# Patient Record
Sex: Male | Born: 1975
Health system: Southern US, Community
[De-identification: ages and names within clinical notes are randomized; demographics above are authoritative.]

## PROBLEM LIST (undated history)

## (undated) DIAGNOSIS — Z8619 Personal history of other infectious and parasitic diseases: Secondary | ICD-10-CM

## (undated) DIAGNOSIS — C4490 Unspecified malignant neoplasm of skin, unspecified: Secondary | ICD-10-CM

## (undated) HISTORY — DX: Personal history of other infectious and parasitic diseases: Z86.19

## (undated) HISTORY — DX: Unspecified malignant neoplasm of skin, unspecified: C44.90

## (undated) HISTORY — PX: NO PAST SURGERIES: SHX2092

---

## 2006-06-28 ENCOUNTER — Emergency Department (HOSPITAL_COMMUNITY): Admission: EM | Admit: 2006-06-28 | Discharge: 2006-06-28 | Payer: Self-pay | Admitting: Emergency Medicine

## 2008-01-09 IMAGING — CT CT HEAD W/O CM
4 series · 17 of 30 positions shown, 19 images · non-contrast
Comparison: None

CLINICAL DATA: Fall. Alcohol intoxication. Laceration along the left
supraorbital rim.

HEAD CT WITHOUT CONTRAST
TECHNIQUE: 5mm collimated images were obtained from the base of the skull
through the vertex according to standard protocol without contrast.
TECHNIQUE: Axial and coronal plane CT imaging of the maxillofacial structures
was performed including the facial bones, paranasal sinuses, and orbits.  No
intravenous contrast was administered.

[Series 2: brain · axial · 0.47mm/px · z∈[+188,+235]mm · 2 of 28 slices shown]
[im 10/28  brain]
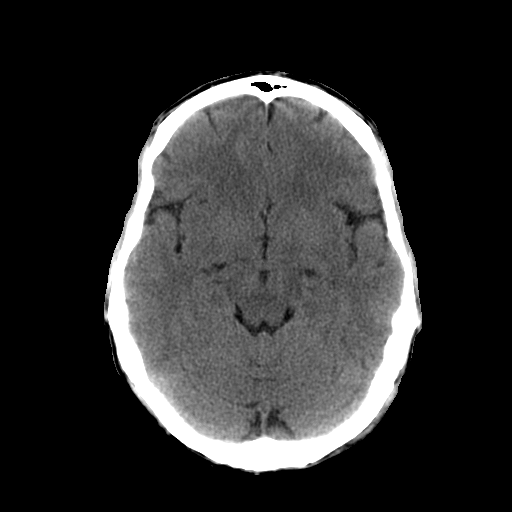
[im 19/28  brain]
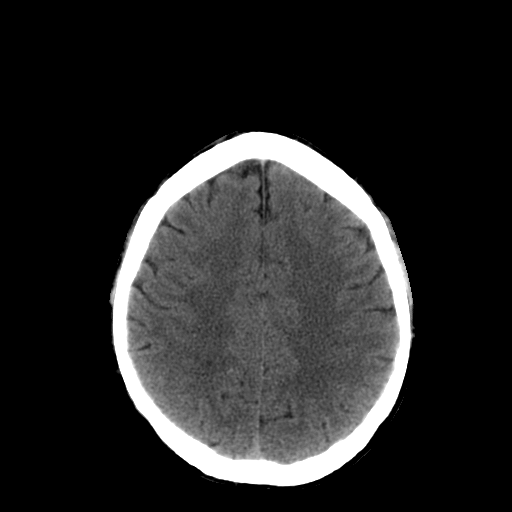

[Series 3: recon 2: brain · axial · 0.47mm/px · z∈[+158,+263]mm · 6 of 56 slices shown]
[im 8/56  brain]
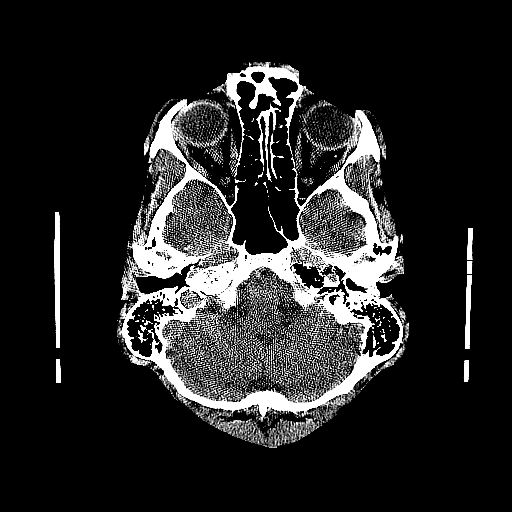
[im 16/56  brain]
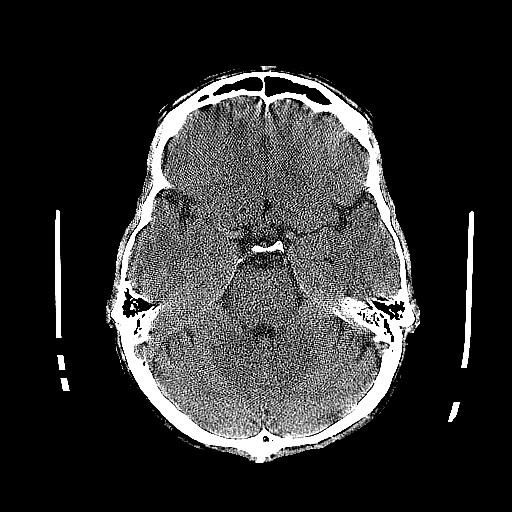
[im 24/56  brain]
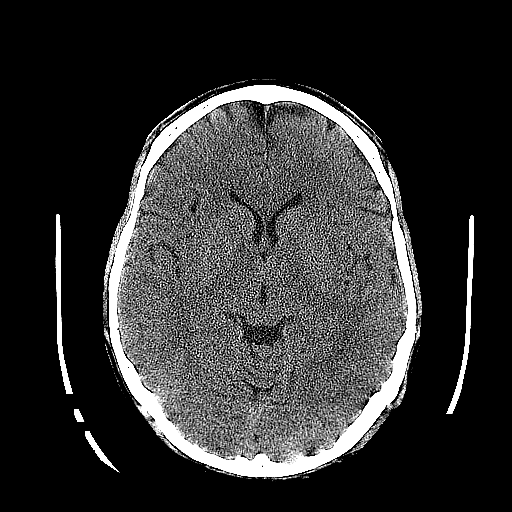
[im 32/56  brain]
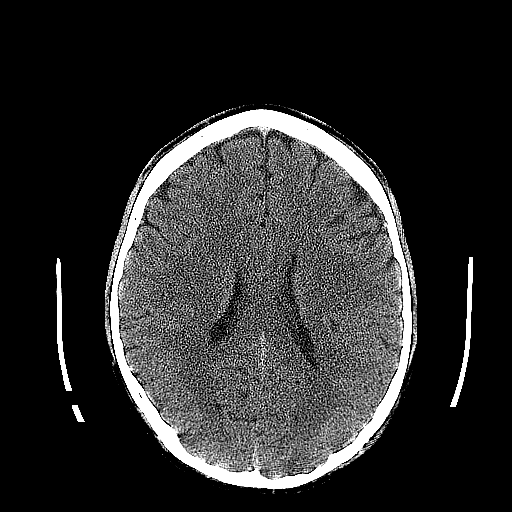
[im 40/56  brain]
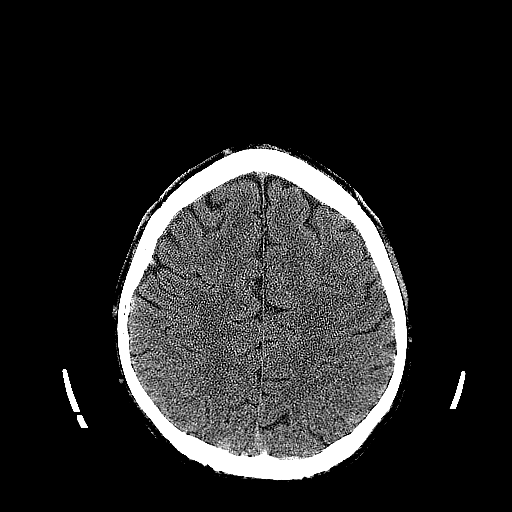
[im 48/56  brain]
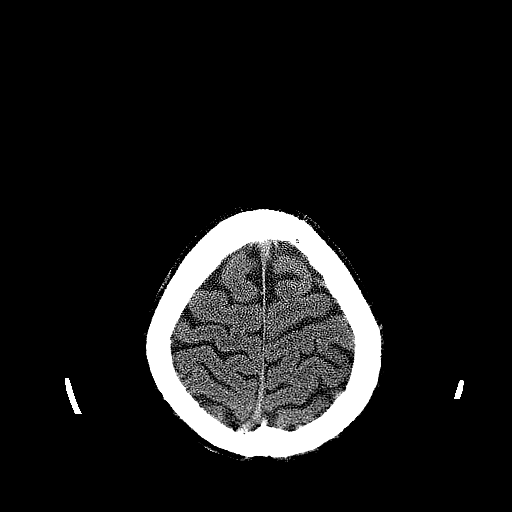

[Series 5: recon 2: supine facial bones · axial · 0.33mm/px · z∈[+42,+157]mm · 7 of 62 slices shown, 9 images]
[im 8/62  brain]
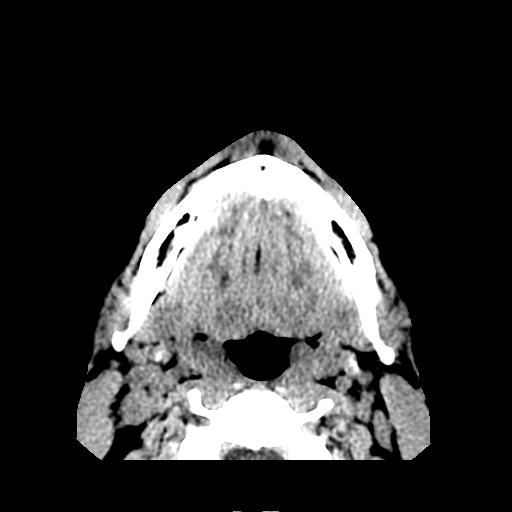
[im 8/62  bone]
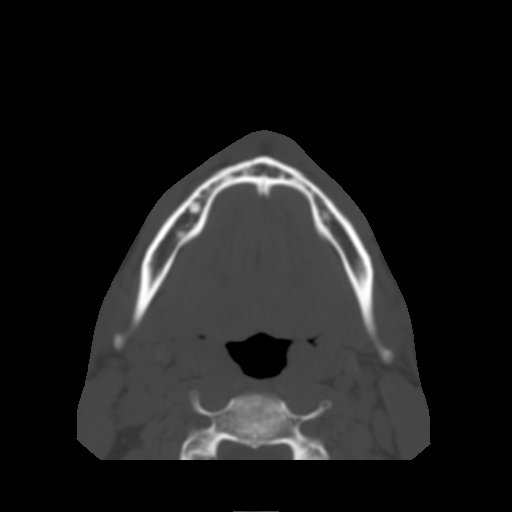
[im 16/62  brain]
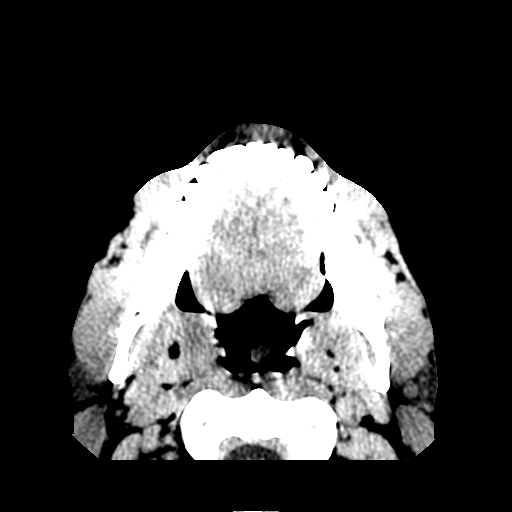
[im 23/62  brain]
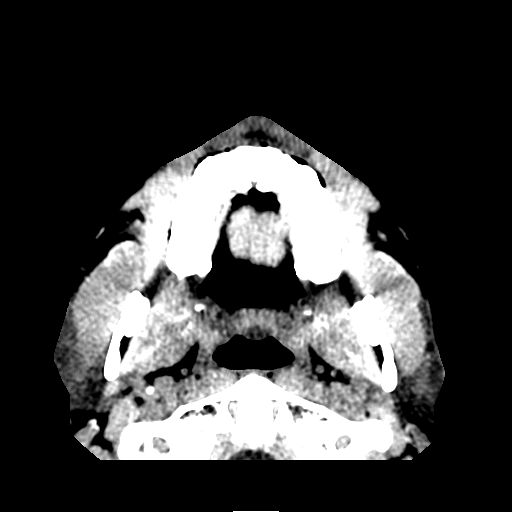
[im 31/62  brain]
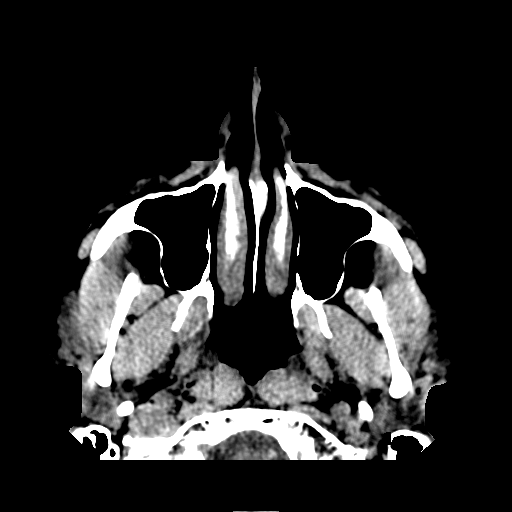
[im 39/62  brain]
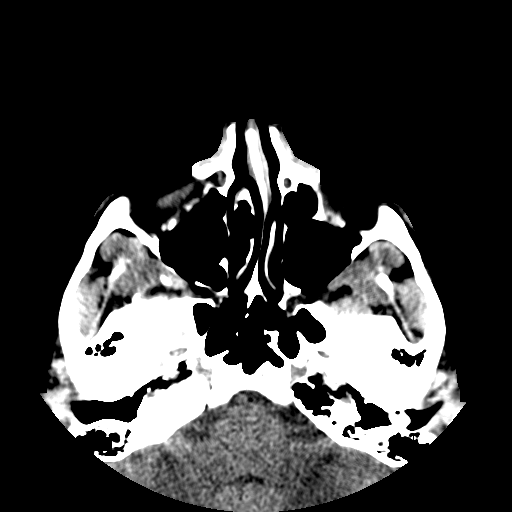
[im 39/62  bone]
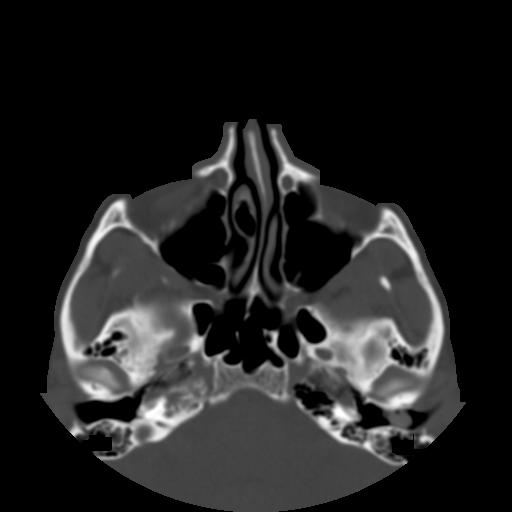
[im 46/62  brain]
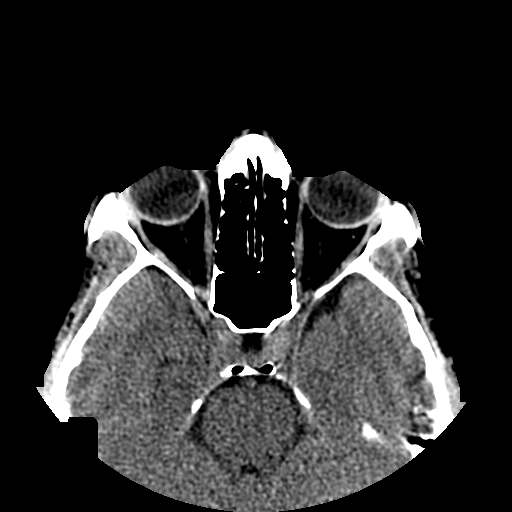
[im 54/62  brain]
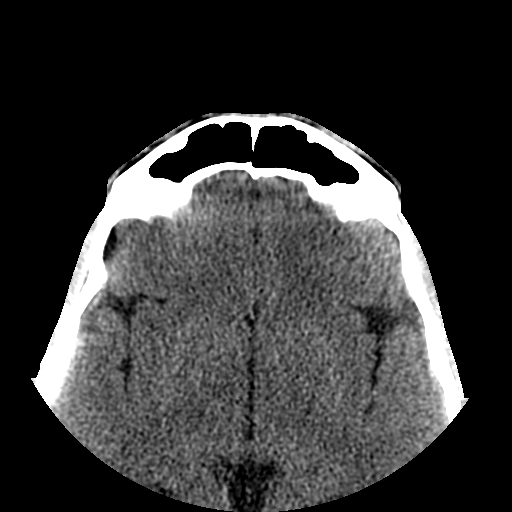

[Series 600: reformatted · coronal · 0.29mm/px · 2 of 63 slices shown]
[im 8/63  brain]
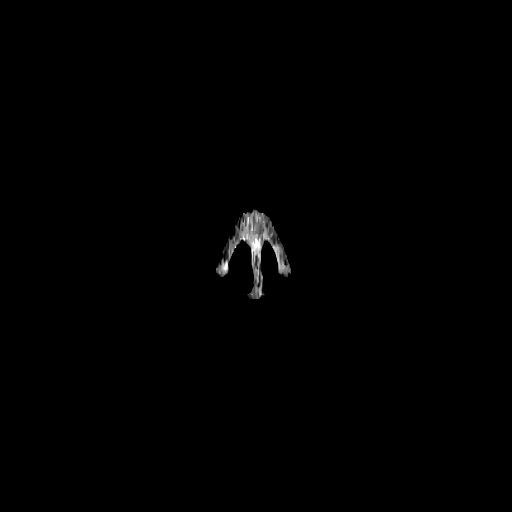
[im 16/63  brain]
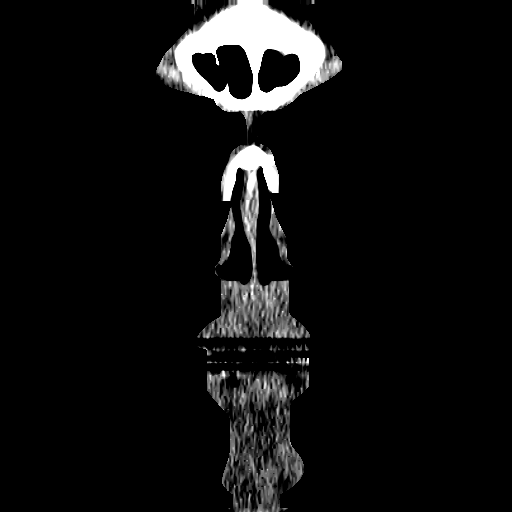

[17 of 30 positions shown; findings below may reference images not displayed]

FINDINGS: No intracranial hemorrhage, mass lesion, or acute CVA is identified.
No hydrocephalus or significant abnormal extra-axial fluid collection noted. No
acute intracranial findings are identified.

IMPRESSION

1. No acute intracranial findings.

MAXILLOFACIAL CT WITHOUT CONTRAST
FINDINGS: No facial fracture identified. No significant intraorbital
abnormality detected. The globes appear grossly intact. The zygomatic arches and
pterygoid plates appear normal.

IMPRESSION

1. No facial fracture or orbital abnormality is identified.

## 2017-09-10 ENCOUNTER — Ambulatory Visit: Payer: BLUE CROSS/BLUE SHIELD | Admitting: Physician Assistant

## 2017-09-10 ENCOUNTER — Encounter: Payer: Self-pay | Admitting: Physician Assistant

## 2017-09-10 ENCOUNTER — Other Ambulatory Visit: Payer: Self-pay

## 2017-09-10 VITALS — BP 120/90 | HR 88 | Temp 98.0°F | Resp 14 | Ht 74.0 in | Wt 172.0 lb

## 2017-09-10 DIAGNOSIS — F419 Anxiety disorder, unspecified: Secondary | ICD-10-CM | POA: Diagnosis not present

## 2017-09-10 DIAGNOSIS — F329 Major depressive disorder, single episode, unspecified: Secondary | ICD-10-CM | POA: Diagnosis not present

## 2017-09-10 DIAGNOSIS — F32A Depression, unspecified: Secondary | ICD-10-CM

## 2017-09-10 MED ORDER — CITALOPRAM HYDROBROMIDE 20 MG PO TABS: ORAL_TABLET | ORAL | 3 refills | Status: DC

## 2017-09-10 NOTE — Patient Instructions (Signed)
Please start the Citalopram taking as directed. Use the handout given to schedule an appointment with a counselor. This will be very beneficial.   Please follow-up with me in 3-4 weeks. We can do your physical at that time.  If you note any worsening mood, stop the medication and call me.

## 2017-09-10 NOTE — Progress Notes (Signed)
Patient presents to clinic today to establish care.  Acute Concerns: Patient endorses issue with anxiety. 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 Has been promoted quickly to a position with much more responsibility which is causing some noted increase in stress. Notes he was handling this very well at first but over the past couple of months has noted significant anxiety on a daily basis when at work. Sometimes culminating into significant acute anxiety. Endorses some depressed mood and easy tearfulness but denies anhedonia. Denies SI/HI. Feels symptoms are debilitating. Sleep and appetite are affected. Has missed a few days of work due to symptoms. Would like to discuss treatment options.  Chronic Issues: History of Skin Cancer -- patient unsure of what type. Will need records. Has history of Moh's surgery on his lower back. Is still followed by Dr. Nevada Crane, Dermatology.   Health Maintenance: Immunizations -- Tetanus up-to-date per patient. Will obtain records. Flu shot deferred until physical.   Past Medical History:  Diagnosis Date  . History of chickenpox   . Skin cancer    Unsure of type -- Mohs procedure    Past Surgical History:  Procedure Laterality Date  . NO PAST SURGERIES      Current Outpatient Medications on File Prior to Visit  Medication Sig Dispense Refill  . Multiple Vitamin (MULTIVITAMIN WITH MINERALS) TABS tablet Take 1 tablet by mouth daily.     No current facility-administered medications on file prior to visit.     No Known Allergies  Family History  Problem Relation Age of Onset  . Arrhythmia Mother   . Healthy Father     Social History   Socioeconomic History  . Marital status: Single    Spouse name: Not on file  . Number of children: Not on file  . Years of education: Not on file  . Highest education level: Not on file  Social Needs  . Financial resource strain: Not on file  . Food insecurity - worry: Not on file  . Food insecurity -  inability: Not on file  . Transportation needs - medical: Not on file  . Transportation needs - non-medical: Not on file  Occupational History  . Occupation: Garment/textile technologist  Tobacco Use  . Smoking status: Never Smoker  . Smokeless tobacco: Never Used  Substance and Sexual Activity  . Alcohol use: Yes    Comment: 2-3 on weekends  . Drug use: No  . Sexual activity: No    Partners: Female  Other Topics Concern  . Not on file  Social History Narrative  . Not on file   Review of Systems  Constitutional: Negative for fever and weight loss.  HENT: Negative for ear discharge, ear pain, hearing loss and tinnitus.   Eyes: Negative for blurred vision, double vision, photophobia and pain.  Respiratory: Negative for cough and shortness of breath.   Cardiovascular: Negative for chest pain and palpitations.  Gastrointestinal: Negative for abdominal pain, blood in stool, constipation, diarrhea, heartburn, melena, nausea and vomiting.  Genitourinary: Negative for dysuria, flank pain, frequency, hematuria and urgency.  Musculoskeletal: Negative for falls.  Neurological: Negative for dizziness, loss of consciousness and headaches.  Endo/Heme/Allergies: Negative for environmental allergies.  Psychiatric/Behavioral: Positive for depression. Negative for hallucinations, substance abuse and suicidal ideas. The patient is nervous/anxious. The patient does not have insomnia.    BP 120/90   Pulse 88   Temp 98 F (36.7 C) (Oral)   Resp 14   Ht 6\' 2"  (1.88 m)  Wt 172 lb (78 kg)   SpO2 96%   BMI 22.08 kg/m   Physical Exam  Constitutional: He is oriented to person, place, and time and well-developed, well-nourished, and in no distress.  HENT:  Head: Normocephalic and atraumatic.  Right Ear: External ear normal.  Left Ear: External ear normal.  Nose: Nose normal.  Mouth/Throat: Oropharynx is clear and moist. No oropharyngeal exudate.  TM within normal limits bilaterally.   Eyes: Conjunctivae are  normal.  Neck: Neck supple. No thyromegaly present.  Cardiovascular: Normal rate, regular rhythm, normal heart sounds and intact distal pulses.  Pulmonary/Chest: Effort normal and breath sounds normal. No respiratory distress. He has no wheezes. He has no rales. He exhibits no tenderness.  Neurological: He is alert and oriented to person, place, and time.  Skin: Skin is warm and dry. No rash noted.  Psychiatric: Affect normal.  Vitals reviewed.   Assessment/Plan: Anxiety and depression Discussed treatment options. Patient has decided on a combination of counseling and pharmacotherapy. Handout give for [facility]. He is to call and schedule an appointment. Will start Citalopram 10 mg daily x 2 weeks, then increasing to 20 mg daily. Follow-up in 3-4 weeks. Will complete CPE at that time. Alarm signs/symptoms prompting ER assessment reviewed with patient who voiced understanding and agreement with the plan.     Leeanne Rio, PA-C

## 2017-09-10 NOTE — Assessment & Plan Note (Signed)
Discussed treatment options. Patient has decided on a combination of counseling and pharmacotherapy. Handout give for [facility]. He is to call and schedule an appointment. Will start Citalopram 10 mg daily x 2 weeks, then increasing to 20 mg daily. Follow-up in 3-4 weeks. Will complete CPE at that time. Alarm signs/symptoms prompting ER assessment reviewed with patient who voiced understanding and agreement with the plan.

## 2017-10-07 ENCOUNTER — Other Ambulatory Visit: Payer: Self-pay

## 2017-10-07 ENCOUNTER — Ambulatory Visit (INDEPENDENT_AMBULATORY_CARE_PROVIDER_SITE_OTHER): Payer: BLUE CROSS/BLUE SHIELD | Admitting: Physician Assistant

## 2017-10-07 ENCOUNTER — Encounter: Payer: Self-pay | Admitting: Physician Assistant

## 2017-10-07 VITALS — BP 110/86 | HR 83 | Temp 98.5°F | Resp 16 | Ht 74.0 in | Wt 167.0 lb

## 2017-10-07 DIAGNOSIS — F419 Anxiety disorder, unspecified: Secondary | ICD-10-CM

## 2017-10-07 DIAGNOSIS — F329 Major depressive disorder, single episode, unspecified: Secondary | ICD-10-CM

## 2017-10-07 DIAGNOSIS — Z0000 Encounter for general adult medical examination without abnormal findings: Secondary | ICD-10-CM | POA: Insufficient documentation

## 2017-10-07 DIAGNOSIS — F32A Depression, unspecified: Secondary | ICD-10-CM

## 2017-10-07 LAB — LIPID PANEL
CHOLESTEROL: 170 mg/dL (ref 0–200)
HDL: 82.5 mg/dL (ref >39.00)
LDL Cholesterol: 76 mg/dL (ref 0–99)
NonHDL: 87.58
TRIGLYCERIDES: 60 mg/dL (ref 0.0–149.0)
Total CHOL/HDL Ratio: 2
VLDL: 12 mg/dL (ref 0.0–40.0)

## 2017-10-07 LAB — CBC WITH DIFFERENTIAL/PLATELET
BASOS ABS: 0.1 10*3/uL (ref 0.0–0.1)
BASOS PCT: 1.3 % (ref 0.0–3.0)
EOS ABS: 0.1 10*3/uL (ref 0.0–0.7)
Eosinophils Relative: 2.5 % (ref 0.0–5.0)
HEMATOCRIT: 45.3 % (ref 39.0–52.0)
Hemoglobin: 15.8 g/dL (ref 13.0–17.0)
LYMPHS PCT: 26.3 % (ref 12.0–46.0)
Lymphs Abs: 1.5 10*3/uL (ref 0.7–4.0)
MCHC: 34.9 g/dL (ref 30.0–36.0)
MCV: 94.3 fl (ref 78.0–100.0)
Monocytes Absolute: 0.5 10*3/uL (ref 0.1–1.0)
Monocytes Relative: 9 % (ref 3.0–12.0)
Neutro Abs: 3.5 10*3/uL (ref 1.4–7.7)
Neutrophils Relative %: 60.9 % (ref 43.0–77.0)
Platelets: 233 10*3/uL (ref 150.0–400.0)
RBC: 4.81 Mil/uL (ref 4.22–5.81)
RDW: 13.4 % (ref 11.5–15.5)
WBC: 5.8 10*3/uL (ref 4.0–10.5)

## 2017-10-07 LAB — COMPREHENSIVE METABOLIC PANEL
ALBUMIN: 4.5 g/dL (ref 3.5–5.2)
ALT: 22 U/L (ref 0–53)
AST: 14 U/L (ref 0–37)
Alkaline Phosphatase: 61 U/L (ref 39–117)
BILIRUBIN TOTAL: 0.9 mg/dL (ref 0.2–1.2)
BUN: 18 mg/dL (ref 6–23)
CALCIUM: 10.4 mg/dL (ref 8.4–10.5)
CO2: 31 mEq/L (ref 19–32)
CREATININE: 1.06 mg/dL (ref 0.40–1.50)
Chloride: 102 mEq/L (ref 96–112)
GFR: 80.99 mL/min (ref >60.00)
Glucose, Bld: 90 mg/dL (ref 70–99)
Potassium: 4.5 mEq/L (ref 3.5–5.1)
Sodium: 141 mEq/L (ref 135–145)
Total Protein: 7.3 g/dL (ref 6.0–8.3)

## 2017-10-07 MED ORDER — SERTRALINE HCL 50 MG PO TABS: 50.0000 mg | ORAL_TABLET | Freq: Every day | ORAL | 3 refills | Status: DC

## 2017-10-07 NOTE — Assessment & Plan Note (Signed)
Depression screen negative. Health Maintenance reviewed. Preventive schedule discussed and handout given in AVS. Will obtain fasting labs today.  

## 2017-10-07 NOTE — Assessment & Plan Note (Signed)
Stop Citalopram.  Start Sertraline at 50 mg daily.  Handout on counseling given. Patient to schedule appt. Follow-up 4 weeks for reassessment.  RTC sooner if needed.

## 2017-10-07 NOTE — Progress Notes (Signed)
Patient presents to clinic today for annual exam.  Patient is fasting for labs.  Acute Concerns: Denies acute concerns today.   Chronic Issues: Anxiety/Depression -- Currently on Citalopram 20 mg over the past month. Is having daily nausea with medication. Denies any improvement in symptoms. Denies SI/HI. Noting daily anhedonia. Would like to discuss other options.   Health Maintenance: Immunizations -- Declines flu shot. Unsure of Tetanus. Wants to check records.   Past Medical History:  Diagnosis Date  . History of chickenpox   . Skin cancer    Unsure of type -- Mohs procedure    Past Surgical History:  Procedure Laterality Date  . NO PAST SURGERIES      Current Outpatient Medications on File Prior to Visit  Medication Sig Dispense Refill  . Multiple Vitamin (MULTIVITAMIN WITH MINERALS) TABS tablet Take 1 tablet by mouth daily.     No current facility-administered medications on file prior to visit.     No Known Allergies  Family History  Problem Relation Age of Onset  . Arrhythmia Mother   . Healthy Father     Social History   Socioeconomic History  . Marital status: Single    Spouse name: Not on file  . Number of children: Not on file  . Years of education: Not on file  . Highest education level: Not on file  Social Needs  . Financial resource strain: Not on file  . Food insecurity - worry: Not on file  . Food insecurity - inability: Not on file  . Transportation needs - medical: Not on file  . Transportation needs - non-medical: Not on file  Occupational History  . Occupation: Garment/textile technologist  Tobacco Use  . Smoking status: Never Smoker  . Smokeless tobacco: Never Used  Substance and Sexual Activity  . Alcohol use: Yes    Comment: 2-3 on weekends  . Drug use: No  . Sexual activity: No    Partners: Female  Other Topics Concern  . Not on file  Social History Narrative  . Not on file   Review of Systems  Constitutional: Negative for fever and  weight loss.  HENT: Negative for ear discharge, ear pain, hearing loss and tinnitus.   Eyes: Negative for blurred vision, double vision, photophobia and pain.  Respiratory: Negative for cough and shortness of breath.   Cardiovascular: Negative for chest pain and palpitations.  Gastrointestinal: Negative for abdominal pain, blood in stool, constipation, diarrhea, heartburn, melena, nausea and vomiting.  Genitourinary: Negative for dysuria, flank pain, frequency, hematuria and urgency.  Musculoskeletal: Negative for falls.  Neurological: Negative for dizziness, loss of consciousness and headaches.  Endo/Heme/Allergies: Negative for environmental allergies.  Psychiatric/Behavioral: Positive for depression. Negative for hallucinations, substance abuse and suicidal ideas. The patient is nervous/anxious. The patient does not have insomnia.    BP 110/86   Pulse 83   Temp 98.5 F (36.9 C) (Oral)   Resp 16   Ht 6\' 2"  (1.88 m)   Wt 167 lb (75.8 kg)   SpO2 97%   BMI 21.44 kg/m   Physical Exam  Constitutional: He is oriented to person, place, and time and well-developed, well-nourished, and in no distress.  HENT:  Head: Normocephalic and atraumatic.  Eyes: Conjunctivae are normal.  Cardiovascular: Normal rate, regular rhythm, normal heart sounds and intact distal pulses.  Pulmonary/Chest: Effort normal and breath sounds normal. No respiratory distress. He has no wheezes. He has no rales. He exhibits no tenderness.  Neurological: He is  alert and oriented to person, place, and time.  Skin: Skin is warm and dry. No rash noted.  Psychiatric: Affect normal.  Vitals reviewed.  Assessment/Plan: Visit for preventive health examination Depression screen negative. Health Maintenance reviewed. Preventive schedule discussed and handout given in AVS. Will obtain fasting labs today.   Anxiety and depression Stop Citalopram.  Start Sertraline at 50 mg daily.  Handout on counseling given. Patient  to schedule appt. Follow-up 4 weeks for reassessment.  RTC sooner if needed.    Leeanne Rio, PA-C

## 2017-10-07 NOTE — Patient Instructions (Addendum)
Please go to the lab for blood work.   Our office will call you with your results unless you have chosen to receive results via MyChart.  If your blood work is normal we will follow-up each year for physicals and as scheduled for chronic medical problems.  If anything is abnormal we will treat accordingly and get you in for a follow-up.  Stop the Citalopram and start the Sertraline daily as directed Use handout given to schedule a counseling appointmnent. Follow-up in 4 weeks.    Preventive Care 43-64 Years, Male Preventive care refers to lifestyle choices and visits with your health care provider that can promote health and wellness. What does preventive care include?  A yearly physical exam. This is also called an annual well check.  Dental exams once or twice a year.  Routine eye exams. Ask your health care provider how often you should have your eyes checked.  Personal lifestyle choices, including: ? Daily care of your teeth and gums. ? Regular physical activity. ? Eating a healthy diet. ? Avoiding tobacco and drug use. ? Limiting alcohol use. ? Practicing safe sex. ? Taking low-dose aspirin every day starting at age 64. What happens during an annual well check? The services and screenings done by your health care provider during your annual well check will depend on your age, overall health, lifestyle risk factors, and family history of disease. Counseling Your health care provider may ask you questions about your:  Alcohol use.  Tobacco use.  Drug use.  Emotional well-being.  Home and relationship well-being.  Sexual activity.  Eating habits.  Work and work Statistician.  Screening You may have the following tests or measurements:  Height, weight, and BMI.  Blood pressure.  Lipid and cholesterol levels. These may be checked every 5 years, or more frequently if you are over 21 years old.  Skin check.  Lung cancer screening. You may have this  screening every year starting at age 10 if you have a 30-pack-year history of smoking and currently smoke or have quit within the past 15 years.  Fecal occult blood test (FOBT) of the stool. You may have this test every year starting at age 58.  Flexible sigmoidoscopy or colonoscopy. You may have a sigmoidoscopy every 5 years or a colonoscopy every 10 years starting at age 56.  Prostate cancer screening. Recommendations will vary depending on your family history and other risks.  Hepatitis C blood test.  Hepatitis B blood test.  Sexually transmitted disease (STD) testing.  Diabetes screening. This is done by checking your blood sugar (glucose) after you have not eaten for a while (fasting). You may have this done every 1-3 years.  Discuss your test results, treatment options, and if necessary, the need for more tests with your health care provider. Vaccines Your health care provider may recommend certain vaccines, such as:  Influenza vaccine. This is recommended every year.  Tetanus, diphtheria, and acellular pertussis (Tdap, Td) vaccine. You may need a Td booster every 10 years.  Varicella vaccine. You may need this if you have not been vaccinated.  Zoster vaccine. You may need this after age 71.  Measles, mumps, and rubella (MMR) vaccine. You may need at least one dose of MMR if you were born in 1957 or later. You may also need a second dose.  Pneumococcal 13-valent conjugate (PCV13) vaccine. You may need this if you have certain conditions and have not been vaccinated.  Pneumococcal polysaccharide (PPSV23) vaccine. You may need one  or two doses if you smoke cigarettes or if you have certain conditions.  Meningococcal vaccine. You may need this if you have certain conditions.  Hepatitis A vaccine. You may need this if you have certain conditions or if you travel or work in places where you may be exposed to hepatitis A.  Hepatitis B vaccine. You may need this if you have  certain conditions or if you travel or work in places where you may be exposed to hepatitis B.  Haemophilus influenzae type b (Hib) vaccine. You may need this if you have certain risk factors.  Talk to your health care provider about which screenings and vaccines you need and how often you need them. This information is not intended to replace advice given to you by your health care provider. Make sure you discuss any questions you have with your health care provider. Document Released: 08/19/2015 Document Revised: 04/11/2016 Document Reviewed: 05/24/2015 Elsevier Interactive Patient Education  [employer].

## 2017-10-08 ENCOUNTER — Encounter: Payer: Self-pay | Admitting: Emergency Medicine

## 2017-10-08 ENCOUNTER — Encounter: Payer: BLUE CROSS/BLUE SHIELD | Admitting: Physician Assistant

## 2017-10-31 ENCOUNTER — Ambulatory Visit: Payer: BLUE CROSS/BLUE SHIELD | Admitting: Physician Assistant

## 2017-11-05 ENCOUNTER — Encounter: Payer: Self-pay | Admitting: Physician Assistant

## 2017-11-05 ENCOUNTER — Other Ambulatory Visit: Payer: Self-pay

## 2017-11-05 ENCOUNTER — Ambulatory Visit: Payer: BLUE CROSS/BLUE SHIELD | Admitting: Physician Assistant

## 2017-11-05 DIAGNOSIS — F32A Depression, unspecified: Secondary | ICD-10-CM

## 2017-11-05 DIAGNOSIS — F419 Anxiety disorder, unspecified: Secondary | ICD-10-CM

## 2017-11-05 DIAGNOSIS — F329 Major depressive disorder, single episode, unspecified: Secondary | ICD-10-CM | POA: Diagnosis not present

## 2017-11-05 MED ORDER — SERTRALINE HCL 100 MG PO TABS: 100.0000 mg | ORAL_TABLET | Freq: Every day | ORAL | 1 refills | Status: DC

## 2017-11-05 NOTE — Assessment & Plan Note (Signed)
Much improved with switch to Sertraline. Increase to 100 mg Sertraline daily to help further. Follow-up 3 months.

## 2017-11-05 NOTE — Patient Instructions (Signed)
Please start the new dose of the medication as directed. I am glad you are doing better. Try to use the Pure ZZZs to help with sleep.  Follow-up with counseling as scheduled.  As long as things are going well, please follow-up in 3 months.

## 2017-11-05 NOTE — Progress Notes (Signed)
Patient presents to clinic today for follow-up of anxiety/depression. At last visit Citalopram was changed to Sertraline due to intolerance and no improvement in symptoms. Patient is taking 50 mg Sertraline daily and tolerating well. Notes improvement in mood with regimen. Denies SI/HI. Still feels there is room for improvement. Has appointment pending with counseling.   Past Medical History:  Diagnosis Date  . History of chickenpox   . Skin cancer    Unsure of type -- Mohs procedure    Current Outpatient Medications on File Prior to Visit  Medication Sig Dispense Refill  . Multiple Vitamin (MULTIVITAMIN WITH MINERALS) TABS tablet Take 1 tablet by mouth daily.     No current facility-administered medications on file prior to visit.     No Known Allergies  Family History  Problem Relation Age of Onset  . Arrhythmia Mother   . Healthy Father     Social History   Socioeconomic History  . Marital status: Single    Spouse name: Not on file  . Number of children: Not on file  . Years of education: Not on file  . Highest education level: Not on file  Occupational History  . Occupation: Garment/textile technologist  Social Needs  . Financial resource strain: Not on file  . Food insecurity:    Worry: Not on file    Inability: Not on file  . Transportation needs:    Medical: Not on file    Non-medical: Not on file  Tobacco Use  . Smoking status: Never Smoker  . Smokeless tobacco: Never Used  Substance and Sexual Activity  . Alcohol use: Yes    Comment: 2-3 on weekends  . Drug use: No  . Sexual activity: Never    Partners: Female  Lifestyle  . Physical activity:    Days per week: Not on file    Minutes per session: Not on file  . Stress: Not on file  Relationships  . Social connections:    Talks on phone: Not on file    Gets together: Not on file    Attends religious service: Not on file    Active member of club or organization: Not on file    Attends meetings of clubs or  organizations: Not on file    Relationship status: Not on file  Other Topics Concern  . Not on file  Social History Narrative  . Not on file   Review of Systems - See HPI.  All other ROS are negative.  BP 112/86   Pulse 74   Temp 98.9 F (37.2 C) (Oral)   Resp 14   Ht 6\' 2"  (1.88 m)   Wt 171 lb (77.6 kg)   SpO2 96%   BMI 21.96 kg/m   Physical Exam  Constitutional: He is oriented to person, place, and time and well-developed, well-nourished, and in no distress.  HENT:  Head: Normocephalic and atraumatic.  Eyes: Conjunctivae are normal.  Neck: Neck supple.  Cardiovascular: Normal rate, regular rhythm, normal heart sounds and intact distal pulses.  Pulmonary/Chest: Effort normal and breath sounds normal. No respiratory distress. He has no wheezes. He has no rales. He exhibits no tenderness.  Neurological: He is alert and oriented to person, place, and time.  Skin: Skin is warm and dry. No rash noted.  Psychiatric: Affect normal.  Vitals reviewed.   Recent Results (from the past 2160 hour(s))  CBC with Differential/Platelet     Status: None   Collection Time: 10/07/17  2:22 PM  Result Value Ref Range   WBC 5.8 4.0 - 10.5 K/uL   RBC 4.81 4.22 - 5.81 Mil/uL   Hemoglobin 15.8 13.0 - 17.0 g/dL   HCT 45.3 39.0 - 52.0 %   MCV 94.3 78.0 - 100.0 fl   MCHC 34.9 30.0 - 36.0 g/dL   RDW 13.4 11.5 - 15.5 %   Platelets 233.0 150.0 - 400.0 K/uL   Neutrophils Relative % 60.9 43.0 - 77.0 %   Lymphocytes Relative 26.3 12.0 - 46.0 %   Monocytes Relative 9.0 3.0 - 12.0 %   Eosinophils Relative 2.5 0.0 - 5.0 %   Basophils Relative 1.3 0.0 - 3.0 %   Neutro Abs 3.5 1.4 - 7.7 K/uL   Lymphs Abs 1.5 0.7 - 4.0 K/uL   Monocytes Absolute 0.5 0.1 - 1.0 K/uL   Eosinophils Absolute 0.1 0.0 - 0.7 K/uL   Basophils Absolute 0.1 0.0 - 0.1 K/uL  Comprehensive metabolic panel     Status: None   Collection Time: 10/07/17  2:22 PM  Result Value Ref Range   Sodium 141 135 - 145 mEq/L   Potassium 4.5  3.5 - 5.1 mEq/L   Chloride 102 96 - 112 mEq/L   CO2 31 19 - 32 mEq/L   Glucose, Bld 90 70 - 99 mg/dL   BUN 18 6 - 23 mg/dL   Creatinine, Ser 1.06 0.40 - 1.50 mg/dL   Total Bilirubin 0.9 0.2 - 1.2 mg/dL   Alkaline Phosphatase 61 39 - 117 U/L   AST 14 0 - 37 U/L   ALT 22 0 - 53 U/L   Total Protein 7.3 6.0 - 8.3 g/dL   Albumin 4.5 3.5 - 5.2 g/dL   Calcium 10.4 8.4 - 10.5 mg/dL   GFR 80.99 >60.00 mL/min  Lipid panel     Status: None   Collection Time: 10/07/17  2:22 PM  Result Value Ref Range   Cholesterol 170 0 - 200 mg/dL    Comment: ATP III Classification       Desirable:  < 200 mg/dL               Borderline High:  200 - 239 mg/dL          High:  > = 240 mg/dL   Triglycerides 60.0 0.0 - 149.0 mg/dL    Comment: Normal:  <150 mg/dLBorderline High:  150 - 199 mg/dL   HDL 82.50 >39.00 mg/dL   VLDL 12.0 0.0 - 40.0 mg/dL   LDL Cholesterol 76 0 - 99 mg/dL   Total CHOL/HDL Ratio 2     Comment:                Men          Women1/2 Average Risk     3.4          3.3Average Risk          5.0          4.42X Average Risk          9.6          7.13X Average Risk          15.0          11.0                       NonHDL 87.58     Comment: NOTE:  Non-HDL goal should be 30 mg/dL higher than patient's LDL goal (i.e. LDL goal of <  70 mg/dL, would have non-HDL goal of < 100 mg/dL)   Assessment/Plan: Anxiety and depression Much improved with switch to Sertraline. Increase to 100 mg Sertraline daily to help further. Follow-up 3 months.     Leeanne Rio, PA-C

## 2017-11-06 ENCOUNTER — Ambulatory Visit: Payer: BLUE CROSS/BLUE SHIELD | Admitting: Physician Assistant

## 2017-11-18 ENCOUNTER — Ambulatory Visit (INDEPENDENT_AMBULATORY_CARE_PROVIDER_SITE_OTHER): Payer: BLUE CROSS/BLUE SHIELD | Admitting: Licensed Clinical Social Worker

## 2017-11-18 DIAGNOSIS — F321 Major depressive disorder, single episode, moderate: Secondary | ICD-10-CM | POA: Diagnosis not present

## 2017-11-27 ENCOUNTER — Ambulatory Visit (INDEPENDENT_AMBULATORY_CARE_PROVIDER_SITE_OTHER): Payer: BLUE CROSS/BLUE SHIELD | Admitting: Licensed Clinical Social Worker

## 2017-11-27 DIAGNOSIS — F324 Major depressive disorder, single episode, in partial remission: Secondary | ICD-10-CM | POA: Diagnosis not present

## 2018-01-03 ENCOUNTER — Ambulatory Visit: Payer: BLUE CROSS/BLUE SHIELD | Admitting: Licensed Clinical Social Worker

## 2018-01-16 ENCOUNTER — Ambulatory Visit: Payer: BLUE CROSS/BLUE SHIELD | Admitting: Licensed Clinical Social Worker

## 2018-02-18 ENCOUNTER — Ambulatory Visit: Payer: BLUE CROSS/BLUE SHIELD | Admitting: Licensed Clinical Social Worker

## 2018-07-14 ENCOUNTER — Ambulatory Visit (INDEPENDENT_AMBULATORY_CARE_PROVIDER_SITE_OTHER): Payer: BLUE CROSS/BLUE SHIELD

## 2018-07-14 DIAGNOSIS — Z23 Encounter for immunization: Secondary | ICD-10-CM

## 2019-06-16 ENCOUNTER — Telehealth: Payer: Self-pay | Admitting: Physician Assistant

## 2019-06-16 NOTE — Telephone Encounter (Signed)
LM asking pt to call back to let us know if he still plans on using [facility] as his PCP and if so to please schedule a CPE appt with him. ELEA

## 2019-12-30 ENCOUNTER — Encounter: Payer: Self-pay | Admitting: Physician Assistant

## 2019-12-30 ENCOUNTER — Encounter: Payer: BLUE CROSS/BLUE SHIELD | Admitting: Physician Assistant

## 2020-03-03 DIAGNOSIS — R6889 Other general symptoms and signs: Secondary | ICD-10-CM | POA: Insufficient documentation

## 2021-02-07 ENCOUNTER — Ambulatory Visit: Payer: Self-pay | Admitting: Family Medicine

## 2021-02-27 ENCOUNTER — Encounter: Payer: Self-pay | Admitting: Family Medicine

## 2021-02-27 ENCOUNTER — Ambulatory Visit (INDEPENDENT_AMBULATORY_CARE_PROVIDER_SITE_OTHER): Payer: BC Managed Care – PPO | Admitting: Family Medicine

## 2021-02-27 ENCOUNTER — Other Ambulatory Visit: Payer: Self-pay

## 2021-02-27 VITALS — BP 150/100 | HR 91 | Temp 98.6°F | Ht 75.0 in | Wt 196.5 lb

## 2021-02-27 DIAGNOSIS — C4490 Unspecified malignant neoplasm of skin, unspecified: Secondary | ICD-10-CM | POA: Diagnosis not present

## 2021-02-27 DIAGNOSIS — I10 Essential (primary) hypertension: Secondary | ICD-10-CM | POA: Diagnosis not present

## 2021-02-27 MED ORDER — LOSARTAN POTASSIUM 50 MG PO TABS: 50.0000 mg | ORAL_TABLET | Freq: Every day | ORAL | 0 refills | Status: DC

## 2021-02-27 NOTE — Patient Instructions (Signed)
https://www.nhlbi.nih.gov/files/docs/public/heart/dash_brief.pdf">  DASH Eating Plan DASH stands for Dietary Approaches to Stop Hypertension. The DASH eating plan is a healthy eating plan that has been shown to: Reduce high blood pressure (hypertension). Reduce your risk for type 2 diabetes, heart disease, and stroke. Help with weight loss. What are tips for following this plan? Reading food labels Check food labels for the amount of salt (sodium) per serving. Choose foods with less than 5 percent of the Daily Value of sodium. Generally, foods with less than 300 milligrams (mg) of sodium per serving fit into this eating plan. To find whole grains, look for the word "whole" as the first word in the ingredient list. Shopping Buy products labeled as "low-sodium" or "no salt added." Buy fresh foods. Avoid canned foods and pre-made or frozen meals. Cooking Avoid adding salt when cooking. Use salt-free seasonings or herbs instead of table salt or sea salt. Check with your health care provider or pharmacist before using salt substitutes. Do not fry foods. Cook foods using healthy methods such as baking, boiling, grilling, roasting, and broiling instead. Cook with heart-healthy oils, such as olive, canola, avocado, soybean, or sunflower oil. Meal planning  Eat a balanced diet that includes: 4 or more servings of fruits and 4 or more servings of vegetables each day. Try to fill one-half of your plate with fruits and vegetables. 6-8 servings of whole grains each day. Less than 6 oz (170 g) of lean meat, poultry, or fish each day. A 3-oz (85-g) serving of meat is about the same size as a deck of cards. One egg equals 1 oz (28 g). 2-3 servings of low-fat dairy each day. One serving is 1 cup (237 mL). 1 serving of nuts, seeds, or beans 5 times each week. 2-3 servings of heart-healthy fats. Healthy fats called omega-3 fatty acids are found in foods such as walnuts, flaxseeds, fortified milks, and eggs.  These fats are also found in cold-water fish, such as sardines, salmon, and mackerel. Limit how much you eat of: Canned or prepackaged foods. Food that is high in trans fat, such as some fried foods. Food that is high in saturated fat, such as fatty meat. Desserts and other sweets, sugary drinks, and other foods with added sugar. Full-fat dairy products. Do not salt foods before eating. Do not eat more than 4 egg yolks a week. Try to eat at least 2 vegetarian meals a week. Eat more home-cooked food and less restaurant, buffet, and fast food.  Lifestyle When eating at a restaurant, ask that your food be prepared with less salt or no salt, if possible. If you drink alcohol: Limit how much you use to: 0-1 drink a day for women who are not pregnant. 0-2 drinks a day for men. Be aware of how much alcohol is in your drink. In the [place] one drink equals one 12 oz bottle of beer (355 mL), one 5 oz glass of wine (148 mL), or one 1 oz glass of hard liquor (44 mL). General information Avoid eating more than 2,300 mg of salt a day. If you have hypertension, you may need to reduce your sodium intake to 1,500 mg a day. Work with your health care provider to maintain a healthy body weight or to lose weight. Ask what an ideal weight is for you. Get at least 30 minutes of exercise that causes your heart to beat faster (aerobic exercise) most days of the week. Activities may include walking, swimming, or biking. Work with your health care provider   or dietitian to adjust your eating plan to your individual calorie needs. What foods should I eat? Fruits All fresh, dried, or frozen fruit. Canned fruit in natural juice (without addedsugar). Vegetables Fresh or frozen vegetables (raw, steamed, roasted, or grilled). Low-sodium or reduced-sodium tomato and vegetable juice. Low-sodium or reduced-sodium tomatosauce and tomato paste. Low-sodium or reduced-sodium canned vegetables. Grains Whole-grain or  whole-wheat bread. Whole-grain or whole-wheat pasta. Brown rice. Oatmeal. Quinoa. Bulgur. Whole-grain and low-sodium cereals. Pita bread.Low-fat, low-sodium crackers. Whole-wheat flour tortillas. Meats and other proteins Skinless chicken or turkey. Ground chicken or turkey. Pork with fat trimmed off. Fish and seafood. Egg whites. Dried beans, peas, or lentils. Unsalted nuts, nut butters, and seeds. Unsalted canned beans. Lean cuts of beef with fat trimmed off. Low-sodium, lean precooked or cured meat, such as sausages or meatloaves. Dairy Low-fat (1%) or fat-free (skim) milk. Reduced-fat, low-fat, or fat-free cheeses. Nonfat, low-sodium ricotta or cottage cheese. Low-fat or nonfatyogurt. Low-fat, low-sodium cheese. Fats and oils Soft margarine without trans fats. Vegetable oil. Reduced-fat, low-fat, or light mayonnaise and salad dressings (reduced-sodium). Canola, safflower, olive, avocado, soybean, andsunflower oils. Avocado. Seasonings and condiments Herbs. Spices. Seasoning mixes without salt. Other foods Unsalted popcorn and pretzels. Fat-free sweets. The items listed above may not be a complete list of foods and beverages you can eat. Contact a dietitian for more information. What foods should I avoid? Fruits Canned fruit in a light or heavy syrup. Fried fruit. Fruit in cream or buttersauce. Vegetables Creamed or fried vegetables. Vegetables in a cheese sauce. Regular canned vegetables (not low-sodium or reduced-sodium). Regular canned tomato sauce and paste (not low-sodium or reduced-sodium). Regular tomato and vegetable juice(not low-sodium or reduced-sodium). Pickles. Olives. Grains Baked goods made with fat, such as croissants, muffins, or some breads. Drypasta or rice meal packs. Meats and other proteins Fatty cuts of meat. Ribs. Fried meat. Bacon. Bologna, salami, and other precooked or cured meats, such as sausages or meat loaves. Fat from the back of a pig (fatback). Bratwurst.  Salted nuts and seeds. Canned beans with added salt. Canned orsmoked fish. Whole eggs or egg yolks. Chicken or turkey with skin. Dairy Whole or 2% milk, cream, and half-and-half. Whole or full-fat cream cheese. Whole-fat or sweetened yogurt. Full-fat cheese. Nondairy creamers. Whippedtoppings. Processed cheese and cheese spreads. Fats and oils Butter. Stick margarine. Lard. Shortening. Ghee. Bacon fat. Tropical oils, suchas coconut, palm kernel, or palm oil. Seasonings and condiments Onion salt, garlic salt, seasoned salt, table salt, and sea salt. Worcestershire sauce. Tartar sauce. Barbecue sauce. Teriyaki sauce. Soy sauce, including reduced-sodium. Steak sauce. Canned and packaged gravies. Fish sauce. Oyster sauce. Cocktail sauce. Store-bought horseradish. Ketchup. Mustard. Meat flavorings and tenderizers. Bouillon cubes. Hot sauces. Pre-made or packaged marinades. Pre-made or packaged taco seasonings. Relishes. Regular saladdressings. Other foods Salted popcorn and pretzels. The items listed above may not be a complete list of foods and beverages you should avoid. Contact a dietitian for more information. Where to find more information National Heart, Lung, and Blood Institute: www.nhlbi.nih.gov American Heart Association: www.heart.org Academy of Nutrition and Dietetics: www.eatright.org National Kidney Foundation: www.kidney.org Summary The DASH eating plan is a healthy eating plan that has been shown to reduce high blood pressure (hypertension). It may also reduce your risk for type 2 diabetes, heart disease, and stroke. When on the DASH eating plan, aim to eat more fresh fruits and vegetables, whole grains, lean proteins, low-fat dairy, and heart-healthy fats. With the DASH eating plan, you should limit salt (sodium) intake to 2,300   mg a day. If you have hypertension, you may need to reduce your sodium intake to 1,500 mg a day. Work with your health care provider or dietitian to adjust  your eating plan to your individual calorie needs. This information is not intended to replace advice given to you by your health care provider. Make sure you discuss any questions you have with your healthcare provider. Document Revised: 06/26/2019 Document Reviewed: 06/26/2019 Elsevier Patient Education  2022 Elsevier Inc.  

## 2021-02-27 NOTE — Progress Notes (Signed)
Clinton Burke - 45 y.o. male MRN [id number]  Date of birth: 1976-04-15  Subjective Chief Complaint  Patient presents with   Establish Care    HPI Clinton Burke is a 45 year old male here today for initial visit to establish care.  He does have history of nonmelanoma skin cancer and is followed by dermatology.  Blood pressure elevated today and was elevated at his biometric screening at work.  He has never been on medication for management of his blood pressure before.  He denies any symptoms related to hypertension including chest pain, shortness of breath, palpitations, headache, vision changes or edema.  He does admit to a diet very high in sodium.  He is a non-smoker.  Stress levels are pretty good.  ROS:  A comprehensive ROS was completed and negative except as noted per HPI  No Known Allergies  Past Medical History:  Diagnosis Date   History of chickenpox    Skin cancer    Unsure of type -- Mohs procedure    Past Surgical History:  Procedure Laterality Date   NO PAST SURGERIES      Social History   Socioeconomic History   Marital status: Single    Spouse name: Not on file   Number of children: Not on file   Years of education: Not on file   Highest education level: Not on file  Occupational History   Occupation: Travel Agent  Tobacco Use   Smoking status: Never   Smokeless tobacco: Never  Vaping Use   Vaping Use: Never used  Substance and Sexual Activity   Alcohol use: Yes    Alcohol/week: 12.0 standard drinks    Types: 12 Cans of beer per week    Comment: Weekly   Drug use: No   Sexual activity: Yes    Partners: Female  Other Topics Concern   Not on file  Social History Narrative   Not on file   Social Determinants of Health   Financial Resource Strain: Not on file  Food Insecurity: Not on file  Transportation Needs: Not on file  Physical Activity: Not on file  Stress: Not on file  Social Connections: Not on file    Family History  Problem Relation  Age of Onset   Skin cancer Mother    Arrhythmia Mother    Breast cancer Mother    Skin cancer Father    Hypertension Father     Health Maintenance  Topic Date Due   HIV Screening  Never done   Hepatitis C Screening  Never done   COLONOSCOPY (Pts 45-78yr Insurance coverage will need to be confirmed)  Never done   INFLUENZA VACCINE  03/06/2021   TETANUS/TDAP  11/15/2021   COVID-19 Vaccine  Completed   Pneumococcal Vaccine 04657Years old  Aged Out   HPV VACCINES  Aged Out     ----------------------------------------------------------------------------------------------------------------------------------------------------------------------------------------------------------------- Physical Exam BP (!) 150/100 (BP Location: Left Arm, Patient Position: Sitting, Cuff Size: Normal)   Pulse 91   Temp 98.6 F (37 C)   Ht '6\' 3"'$  (1.905 m)   Wt 196 lb 8 oz (89.1 kg)   SpO2 100%   BMI 24.56 kg/m   Physical Exam Constitutional:      Appearance: Normal appearance.  HENT:     Head: Normocephalic and atraumatic.  Eyes:     General: No scleral icterus. Cardiovascular:     Rate and Rhythm: Normal rate and regular rhythm.  Pulmonary:     Effort: Pulmonary effort is normal.  Breath sounds: Normal breath sounds.  Musculoskeletal:     Cervical back: Neck supple.  Neurological:     General: No focal deficit present.     Mental Status: He is alert.  Psychiatric:        Mood and Affect: Mood normal.        Behavior: Behavior normal.    ------------------------------------------------------------------------------------------------------------------------------------------------------------------------------------------------------------------- Assessment and Plan  Essential hypertension Blood pressure is quite elevated today.  We discussed dietary changes including reduction in sodium intake.  We also discussed medication options for management of his hypertension.  Starting  losartan 50 mg.  We will go ahead and check baseline labs today.  I will follow-up with him in about 4 weeks.  We will recheck his renal function potassium at that time.  Nonmelanoma skin cancer He is followed by dermatology for management of this.   Meds ordered this encounter  Medications   DISCONTD: losartan (COZAAR) 50 MG tablet    Sig: Take 1 tablet (50 mg total) by mouth daily.    Dispense:  90 tablet    Refill:  0   losartan (COZAAR) 50 MG tablet    Sig: Take 1 tablet (50 mg total) by mouth daily.    Dispense:  90 tablet    Refill:  0    Return in about 4 weeks (around 03/27/2021) for HTN.    This visit occurred during the SARS-CoV-2 public health emergency.  Safety protocols were in place, including screening questions prior to the visit, additional usage of staff PPE, and extensive cleaning of exam room while observing appropriate contact time as indicated for disinfecting solutions.

## 2021-02-27 NOTE — Assessment & Plan Note (Signed)
He is followed by dermatology for management of this.

## 2021-02-27 NOTE — Assessment & Plan Note (Signed)
Blood pressure is quite elevated today.  We discussed dietary changes including reduction in sodium intake.  We also discussed medication options for management of his hypertension.  Starting losartan 50 mg.  We will go ahead and check baseline labs today.  I will follow-up with him in about 4 weeks.  We will recheck his renal function potassium at that time.

## 2021-02-28 LAB — CBC WITH DIFFERENTIAL/PLATELET
Absolute Monocytes: 520 cells/uL (ref 200–950)
Basophils Absolute: 90 cells/uL (ref 0–200)
Basophils Relative: 1.8 %
Eosinophils Absolute: 90 cells/uL (ref 15–500)
Eosinophils Relative: 1.8 %
HCT: 45.5 % (ref 38.5–50.0)
Hemoglobin: 15.5 g/dL (ref 13.2–17.1)
Lymphs Abs: 1525 cells/uL (ref 850–3900)
MCH: 31.5 pg (ref 27.0–33.0)
MCHC: 34.1 g/dL (ref 32.0–36.0)
MCV: 92.5 fL (ref 80.0–100.0)
MPV: 9.4 fL (ref 7.5–12.5)
Monocytes Relative: 10.4 %
Neutro Abs: 2775 cells/uL (ref 1500–7800)
Neutrophils Relative %: 55.5 %
Platelets: 271 10*3/uL (ref 140–400)
RBC: 4.92 10*6/uL (ref 4.20–5.80)
RDW: 13.6 % (ref 11.0–15.0)
Total Lymphocyte: 30.5 %
WBC: 5 10*3/uL (ref 3.8–10.8)

## 2021-02-28 LAB — COMPLETE METABOLIC PANEL WITH GFR
AG Ratio: 1.9 (calc) (ref 1.0–2.5)
ALT: 17 U/L (ref 9–46)
AST: 17 U/L (ref 10–40)
Albumin: 4.7 g/dL (ref 3.6–5.1)
Alkaline phosphatase (APISO): 94 U/L (ref 36–130)
BUN: 13 mg/dL (ref 7–25)
CO2: 29 mmol/L (ref 20–32)
Calcium: 10 mg/dL (ref 8.6–10.3)
Chloride: 102 mmol/L (ref 98–110)
Creat: 1.09 mg/dL (ref 0.60–1.29)
Globulin: 2.5 g/dL (calc) (ref 1.9–3.7)
Glucose, Bld: 94 mg/dL (ref 65–99)
Potassium: 4.6 mmol/L (ref 3.5–5.3)
Sodium: 139 mmol/L (ref 135–146)
Total Bilirubin: 0.5 mg/dL (ref 0.2–1.2)
Total Protein: 7.2 g/dL (ref 6.1–8.1)
eGFR: 85 mL/min/{1.73_m2} (ref >=60)

## 2021-02-28 LAB — TSH: TSH: 0.65 mIU/L (ref 0.40–4.50)

## 2021-03-01 ENCOUNTER — Telehealth: Payer: Self-pay

## 2021-03-01 NOTE — Telephone Encounter (Signed)
-----   Message from Luetta Nutting, DO sent at 02/28/2021  7:53 AM EDT ----- Please let patient know that all labs are normal.   Thanks!  CM

## 2021-03-01 NOTE — Telephone Encounter (Signed)
LVM for pt with lab results.  Charyl Bigger, CMA

## 2021-03-30 ENCOUNTER — Ambulatory Visit (INDEPENDENT_AMBULATORY_CARE_PROVIDER_SITE_OTHER): Payer: BC Managed Care – PPO | Admitting: Family Medicine

## 2021-03-30 ENCOUNTER — Other Ambulatory Visit: Payer: Self-pay

## 2021-03-30 ENCOUNTER — Encounter: Payer: Self-pay | Admitting: Family Medicine

## 2021-03-30 VITALS — BP 149/102 | HR 84 | Temp 98.1°F | Ht 75.0 in | Wt 196.0 lb

## 2021-03-30 DIAGNOSIS — H6120 Impacted cerumen, unspecified ear: Secondary | ICD-10-CM | POA: Insufficient documentation

## 2021-03-30 DIAGNOSIS — H6122 Impacted cerumen, left ear: Secondary | ICD-10-CM | POA: Diagnosis not present

## 2021-03-30 DIAGNOSIS — I10 Essential (primary) hypertension: Secondary | ICD-10-CM | POA: Diagnosis not present

## 2021-03-30 MED ORDER — LOSARTAN POTASSIUM 100 MG PO TABS: 100.0000 mg | ORAL_TABLET | Freq: Every day | ORAL | 1 refills | Status: DC

## 2021-03-30 NOTE — Patient Instructions (Signed)
Try debrox drops for the ear.  Increase losartan to '100mg'$  daily.  Continue to check home readings.  See me again in 3 months.

## 2021-03-30 NOTE — Assessment & Plan Note (Signed)
Blood pressure remains elevated.  Recommend continuation of losartan however will increase to 100 mg.  We discussed following a low-sodium diet.Return in about 3 months (around 06/30/2021) for HTN.

## 2021-03-30 NOTE — Assessment & Plan Note (Signed)
Lavage attempted of the left ear however wax was very desiccated and hardened.  Recommend trying Debrox drops and return if not improving.

## 2021-03-30 NOTE — Progress Notes (Signed)
[name] - 45 y.o. male MRN [id number]  Date of birth: 11/22/1975  Subjective Chief Complaint  Patient presents with   Otitis Externa   Hypertension    HPI Clinton Burke is a 45 year old male here today for follow-up of hypertension.  He also recently returned from vacation is noted since decreased hearing in his left ear.  He denies any pain on this side but sounds are muffled.  Denies drainage from the ear.  Blood pressure does remain elevated today.  He is taking losartan 50 mg daily.  He denies any symptoms related to hypertension including chest pain, shortness of breath, palpitations, headache or vision changes.  ROS:  A comprehensive ROS was completed and negative except as noted per HPI  No Known Allergies  Past Medical History:  Diagnosis Date   History of chickenpox    Skin cancer    Unsure of type -- Mohs procedure    Past Surgical History:  Procedure Laterality Date   NO PAST SURGERIES      Social History   Socioeconomic History   Marital status: Single    Spouse name: Not on file   Number of children: Not on file   Years of education: Not on file   Highest education level: Not on file  Occupational History   Occupation: Travel Agent  Tobacco Use   Smoking status: Never   Smokeless tobacco: Never  Vaping Use   Vaping Use: Never used  Substance and Sexual Activity   Alcohol use: Yes    Alcohol/week: 12.0 standard drinks    Types: 12 Cans of beer per week    Comment: Weekly   Drug use: No   Sexual activity: Yes    Partners: Female  Other Topics Concern   Not on file  Social History Narrative   Not on file   Social Determinants of Health   Financial Resource Strain: Not on file  Food Insecurity: Not on file  Transportation Needs: Not on file  Physical Activity: Not on file  Stress: Not on file  Social Connections: Not on file    Family History  Problem Relation Age of Onset   Skin cancer Mother    Arrhythmia Mother    Breast cancer Mother     Skin cancer Father    Hypertension Father     Health Maintenance  Topic Date Due   Pneumococcal Vaccine 17-67 Years old (1 - PCV) Never done   HIV Screening  Never done   Hepatitis C Screening  Never done   COLONOSCOPY (Pts 45-78yr Insurance coverage will need to be confirmed)  Never done   COVID-19 Vaccine (4 - Booster for [place]) 11/03/2020   INFLUENZA VACCINE  03/06/2021   TETANUS/TDAP  11/15/2021   HPV VACCINES  Aged Out     ----------------------------------------------------------------------------------------------------------------------------------------------------------------------------------------------------------------- Physical Exam BP (!) 149/102 (BP Location: Left Arm, Patient Position: Sitting, Cuff Size: Normal)   Pulse 84   Temp 98.1 F (36.7 C)   Ht '6\' 3"'$  (1.905 m)   Wt 196 lb (88.9 kg)   SpO2 100%   BMI 24.50 kg/m   Physical Exam Constitutional:      Appearance: Normal appearance.  HENT:     Head: Normocephalic and atraumatic.     Ears:     Comments: Cerumen impaction of the left ear canal.  Right ear canal and TM normal. Eyes:     General: No scleral icterus. Cardiovascular:     Rate and Rhythm: Normal rate and regular  rhythm.  Pulmonary:     Effort: Pulmonary effort is normal.     Breath sounds: Normal breath sounds.  Musculoskeletal:     Cervical back: Neck supple.  Neurological:     Mental Status: He is alert.  Psychiatric:        Mood and Affect: Mood normal.        Behavior: Behavior normal.    ------------------------------------------------------------------------------------------------------------------------------------------------------------------------------------------------------------------- Assessment and Plan  Essential hypertension Blood pressure remains elevated.  Recommend continuation of losartan however will increase to 100 mg.  We discussed following a low-sodium diet.Return in about 3 months (around  06/30/2021) for HTN.   Cerumen impaction Lavage attempted of the left ear however wax was very desiccated and hardened.  Recommend trying Debrox drops and return if not improving.   Meds ordered this encounter  Medications   losartan (COZAAR) 100 MG tablet    Sig: Take 1 tablet (100 mg total) by mouth daily.    Dispense:  90 tablet    Refill:  1    Return in about 3 months (around 06/30/2021) for HTN.    This visit occurred during the SARS-CoV-2 public health emergency.  Safety protocols were in place, including screening questions prior to the visit, additional usage of staff PPE, and extensive cleaning of exam room while observing appropriate contact time as indicated for disinfecting solutions.

## 2021-04-12 ENCOUNTER — Encounter: Payer: Self-pay | Admitting: Family Medicine

## 2021-07-06 ENCOUNTER — Ambulatory Visit: Payer: BC Managed Care – PPO | Admitting: Family Medicine

## 2021-07-12 ENCOUNTER — Ambulatory Visit (INDEPENDENT_AMBULATORY_CARE_PROVIDER_SITE_OTHER): Payer: BC Managed Care – PPO | Admitting: Family Medicine

## 2021-07-12 ENCOUNTER — Encounter: Payer: Self-pay | Admitting: Family Medicine

## 2021-07-12 ENCOUNTER — Other Ambulatory Visit: Payer: Self-pay

## 2021-07-12 VITALS — BP 145/92 | HR 90 | Temp 98.2°F | Ht 75.0 in | Wt 194.0 lb

## 2021-07-12 DIAGNOSIS — Z23 Encounter for immunization: Secondary | ICD-10-CM | POA: Diagnosis not present

## 2021-07-12 DIAGNOSIS — I10 Essential (primary) hypertension: Secondary | ICD-10-CM

## 2021-07-12 NOTE — Assessment & Plan Note (Signed)
Continue losartan at current strength.  Recommend low-sodium diet with increased activity for better control of blood pressure as well.  Monitor blood pressures at home.  I asked him to send these readings after a few weeks.  Follow-up in 6 months.

## 2021-07-12 NOTE — Patient Instructions (Signed)
Continue current medication.  Check blood pressure at home a few times per month.

## 2021-07-12 NOTE — Progress Notes (Signed)
Clinton Burke - 45 y.o. male MRN [id number]  Date of birth: 1976/03/10  Subjective Chief Complaint  Patient presents with   Hypertension    HPI Clinton Burke is a 45 year old male here today for follow-up of hypertension.  Reports he is doing well at this time.  Blood pressure has been well controlled with losartan 100 mg daily.  Reading is elevated in clinic today however when he checks at home this has been normal.  He denies symptoms related to hypertension including chest pain, shortness of breath, palpitations, headaches or vision changes.  ROS:  A comprehensive ROS was completed and negative except as noted per HPI  No Known Allergies  Past Medical History:  Diagnosis Date   History of chickenpox    Skin cancer    Unsure of type -- Mohs procedure    Past Surgical History:  Procedure Laterality Date   NO PAST SURGERIES      Social History   Socioeconomic History   Marital status: Single    Spouse name: Not on file   Number of children: Not on file   Years of education: Not on file   Highest education level: Not on file  Occupational History   Occupation: Travel Agent  Tobacco Use   Smoking status: Never   Smokeless tobacco: Never  Vaping Use   Vaping Use: Never used  Substance and Sexual Activity   Alcohol use: Yes    Alcohol/week: 12.0 standard drinks    Types: 12 Cans of beer per week    Comment: Weekly   Drug use: No   Sexual activity: Yes    Partners: Female  Other Topics Concern   Not on file  Social History Narrative   Not on file   Social Determinants of Health   Financial Resource Strain: Not on file  Food Insecurity: Not on file  Transportation Needs: Not on file  Physical Activity: Not on file  Stress: Not on file  Social Connections: Not on file    Family History  Problem Relation Age of Onset   Skin cancer Mother    Arrhythmia Mother    Breast cancer Mother    Skin cancer Father    Hypertension Father     Health Maintenance  Topic  Date Due   HIV Screening  Never done   Hepatitis C Screening  Never done   COLONOSCOPY (Pts 45-73yrs Insurance coverage will need to be confirmed)  Never done   COVID-19 Vaccine (4 - Booster for [place] series) 09/30/2020   Pneumococcal Vaccine 36-50 Years old (1 - PCV) 07/12/2022 (Originally 08/20/1980)   TETANUS/TDAP  11/15/2021   INFLUENZA VACCINE  Completed   HPV VACCINES  Aged Out     ----------------------------------------------------------------------------------------------------------------------------------------------------------------------------------------------------------------- Physical Exam BP (!) 145/92 (BP Location: Left Arm, Patient Position: Sitting, Cuff Size: Normal)   Pulse 90   Temp 98.2 F (36.8 C)   Ht 6\' 3"  (1.905 m)   Wt 194 lb (88 kg)   SpO2 100%   BMI 24.25 kg/m   Physical Exam Constitutional:      Appearance: Normal appearance.  Eyes:     General: No scleral icterus. Cardiovascular:     Rate and Rhythm: Normal rate and regular rhythm.  Pulmonary:     Effort: Pulmonary effort is normal.     Breath sounds: Normal breath sounds.  Neurological:     General: No focal deficit present.     Mental Status: He is alert.  Psychiatric:  Mood and Affect: Mood normal.        Behavior: Behavior normal.    ------------------------------------------------------------------------------------------------------------------------------------------------------------------------------------------------------------------- Assessment and Plan  Essential hypertension Continue losartan at current strength.  Recommend low-sodium diet with increased activity for better control of blood pressure as well.  Monitor blood pressures at home.  I asked him to send these readings after a few weeks.  Follow-up in 6 months.   No orders of the defined types were placed in this encounter.   Return in about 6 months (around 01/10/2022) for HTN.    This visit occurred  during the SARS-CoV-2 public health emergency.  Safety protocols were in place, including screening questions prior to the visit, additional usage of staff PPE, and extensive cleaning of exam room while observing appropriate contact time as indicated for disinfecting solutions.

## 2021-10-06 ENCOUNTER — Other Ambulatory Visit: Payer: Self-pay | Admitting: Family Medicine

## 2022-01-11 ENCOUNTER — Ambulatory Visit: Payer: BC Managed Care – PPO | Admitting: Family Medicine

## 2022-01-14 ENCOUNTER — Other Ambulatory Visit: Payer: Self-pay | Admitting: Family Medicine

## 2022-02-13 ENCOUNTER — Ambulatory Visit: Payer: BC Managed Care – PPO | Admitting: Family Medicine

## 2022-02-17 ENCOUNTER — Other Ambulatory Visit: Payer: Self-pay | Admitting: Family Medicine

## 2022-03-08 ENCOUNTER — Ambulatory Visit: Payer: BC Managed Care – PPO | Admitting: Family Medicine

## 2022-03-21 ENCOUNTER — Other Ambulatory Visit: Payer: Self-pay | Admitting: Family Medicine

## 2022-03-22 ENCOUNTER — Ambulatory Visit: Payer: BC Managed Care – PPO | Admitting: Family Medicine

## 2022-04-11 ENCOUNTER — Other Ambulatory Visit: Payer: Self-pay | Admitting: Family Medicine

## 2022-04-17 ENCOUNTER — Other Ambulatory Visit: Payer: Self-pay | Admitting: Family Medicine

## 2022-04-27 ENCOUNTER — Other Ambulatory Visit: Payer: Self-pay

## 2022-04-27 MED ORDER — LOSARTAN POTASSIUM 100 MG PO TABS: 100.0000 mg | ORAL_TABLET | Freq: Every day | ORAL | 0 refills | Status: DC

## 2022-05-02 ENCOUNTER — Ambulatory Visit: Payer: BC Managed Care – PPO | Admitting: Family Medicine

## 2022-05-15 ENCOUNTER — Ambulatory Visit: Payer: BC Managed Care – PPO | Admitting: Family Medicine

## 2022-05-30 ENCOUNTER — Ambulatory Visit: Payer: BC Managed Care – PPO | Admitting: Family Medicine

## 2022-06-07 ENCOUNTER — Ambulatory Visit: Payer: BC Managed Care – PPO | Admitting: Family Medicine

## 2022-06-12 ENCOUNTER — Ambulatory Visit: Payer: BC Managed Care – PPO | Admitting: Family Medicine

## 2022-06-21 ENCOUNTER — Encounter: Payer: Self-pay | Admitting: Family Medicine

## 2022-06-21 ENCOUNTER — Ambulatory Visit (INDEPENDENT_AMBULATORY_CARE_PROVIDER_SITE_OTHER): Payer: BC Managed Care – PPO | Admitting: Family Medicine

## 2022-06-21 VITALS — BP 133/91 | HR 83 | Wt 191.1 lb

## 2022-06-21 DIAGNOSIS — Z23 Encounter for immunization: Secondary | ICD-10-CM | POA: Diagnosis not present

## 2022-06-21 DIAGNOSIS — Z13220 Encounter for screening for lipoid disorders: Secondary | ICD-10-CM | POA: Diagnosis not present

## 2022-06-21 DIAGNOSIS — I10 Essential (primary) hypertension: Secondary | ICD-10-CM | POA: Diagnosis not present

## 2022-06-21 MED ORDER — LOSARTAN POTASSIUM-HCTZ 100-12.5 MG PO TABS: 1.0000 | ORAL_TABLET | Freq: Every day | ORAL | 1 refills | Status: DC

## 2022-06-21 NOTE — Assessment & Plan Note (Signed)
Blood pressure elevated in clinic today.  Switching losartan to losartan/hydrochlorothiazide.  Low-sodium diet encouraged.  Recommend that he get plenty of sleep each night. Return in about 6 months (around 12/20/2022) for HTN.

## 2022-06-21 NOTE — Patient Instructions (Signed)
Stop losartan.  Start losartan with hctz.  Have labs completed when fasting.   See me again in 6 months.

## 2022-06-21 NOTE — Progress Notes (Signed)
Clinton Burke - 46 y.o. male MRN [id number]  Date of birth: [birth year]  Subjective Chief Complaint  Patient presents with   Follow-up    HPI Clinton Burke is a 46 year old male here today for follow-up visit.  He reports he is doing well.  He reports her blood pressures at home have been a little elevated.  He is taking losartan 100 mg daily as directed.  He has not had any side effects at current strength.  He denies symptoms related to hypertension including chest pain, shortness of breath, palpitations, headaches or vision changes.  ROS:  A comprehensive ROS was completed and negative except as noted per HPI  No Known Allergies  Past Medical History:  Diagnosis Date   History of chickenpox    Skin cancer    Unsure of type -- Mohs procedure    Past Surgical History:  Procedure Laterality Date   NO PAST SURGERIES      Social History   Socioeconomic History   Marital status: Single    Spouse name: Not on file   Number of children: Not on file   Years of education: Not on file   Highest education level: Not on file  Occupational History   Occupation: Travel Agent  Tobacco Use   Smoking status: Never   Smokeless tobacco: Never  Vaping Use   Vaping Use: Never used  Substance and Sexual Activity   Alcohol use: Yes    Alcohol/week: 12.0 standard drinks of alcohol    Types: 12 Cans of beer per week    Comment: Weekly   Drug use: No   Sexual activity: Yes    Partners: Female  Other Topics Concern   Not on file  Social History Narrative   Not on file   Social Determinants of Health   Financial Resource Strain: Not on file  Food Insecurity: Not on file  Transportation Needs: Not on file  Physical Activity: Not on file  Stress: Not on file  Social Connections: Not on file    Family History  Problem Relation Age of Onset   Skin cancer Mother    Arrhythmia Mother    Breast cancer Mother    Skin cancer Father    Hypertension Father     Health Maintenance  Topic  Date Due   HIV Screening  Never done   Hepatitis C Screening  Never done   COLONOSCOPY (Pts 45-42yr Insurance coverage will need to be confirmed)  Never done   COVID-19 Vaccine (4 - Pfizer risk series) 09/30/2020   TETANUS/TDAP  06/21/2032   INFLUENZA VACCINE  Completed   HPV VACCINES  Aged Out     ----------------------------------------------------------------------------------------------------------------------------------------------------------------------------------------------------------------- Physical Exam BP (!) 133/91 (BP Location: Right Arm, Patient Position: Sitting, Cuff Size: Normal)   Pulse 83   Wt 191 lb 1.9 oz (86.7 kg)   SpO2 100%   BMI 23.89 kg/m   Physical Exam Constitutional:      Appearance: Normal appearance.  HENT:     Head: Normocephalic and atraumatic.  Eyes:     General: No scleral icterus. Cardiovascular:     Rate and Rhythm: Normal rate and regular rhythm.  Pulmonary:     Effort: Pulmonary effort is normal.     Breath sounds: Normal breath sounds.  Musculoskeletal:     Cervical back: Neck supple.  Neurological:     Mental Status: He is alert.  Psychiatric:        Mood and Affect: Mood normal.  Behavior: Behavior normal.     ------------------------------------------------------------------------------------------------------------------------------------------------------------------------------------------------------------------- Assessment and Plan  Essential hypertension Blood pressure elevated in clinic today.  Switching losartan to losartan/hydrochlorothiazide.  Low-sodium diet encouraged.  Recommend that he get plenty of sleep each night. Return in about 6 months (around 12/20/2022) for HTN.   Meds ordered this encounter  Medications   losartan-hydrochlorothiazide (HYZAAR) 100-12.5 MG tablet    Sig: Take 1 tablet by mouth daily.    Dispense:  90 tablet    Refill:  1    Return in about 6 months (around 12/20/2022)  for HTN.    This visit occurred during the SARS-CoV-2 public health emergency.  Safety protocols were in place, including screening questions prior to the visit, additional usage of staff PPE, and extensive cleaning of exam room while observing appropriate contact time as indicated for disinfecting solutions.

## 2022-07-05 ENCOUNTER — Other Ambulatory Visit: Payer: BC Managed Care – PPO

## 2022-07-12 ENCOUNTER — Other Ambulatory Visit: Payer: BC Managed Care – PPO

## 2022-07-16 ENCOUNTER — Other Ambulatory Visit: Payer: BC Managed Care – PPO

## 2022-07-17 LAB — LIPID PANEL W/REFLEX DIRECT LDL
Cholesterol: 194 mg/dL (ref <200)
HDL: 101 mg/dL (ref >=40)
LDL Cholesterol (Calc): 79 mg/dL (calc)
Non-HDL Cholesterol (Calc): 93 mg/dL (calc) (ref <130)
Total CHOL/HDL Ratio: 1.9 (calc) (ref <5.0)
Triglycerides: 64 mg/dL (ref <150)

## 2022-07-17 LAB — CBC WITH DIFFERENTIAL/PLATELET
Absolute Monocytes: 495 cells/uL (ref 200–950)
Basophils Absolute: 71 cells/uL (ref 0–200)
Basophils Relative: 1.4 %
Eosinophils Absolute: 133 cells/uL (ref 15–500)
Eosinophils Relative: 2.6 %
HCT: 42.6 % (ref 38.5–50.0)
Hemoglobin: 14.7 g/dL (ref 13.2–17.1)
Lymphs Abs: 1851 cells/uL (ref 850–3900)
MCH: 30.7 pg (ref 27.0–33.0)
MCHC: 34.5 g/dL (ref 32.0–36.0)
MCV: 88.9 fL (ref 80.0–100.0)
MPV: 10 fL (ref 7.5–12.5)
Monocytes Relative: 9.7 %
Neutro Abs: 2550 cells/uL (ref 1500–7800)
Neutrophils Relative %: 50 %
Platelets: 238 10*3/uL (ref 140–400)
RBC: 4.79 10*6/uL (ref 4.20–5.80)
RDW: 14.4 % (ref 11.0–15.0)
Total Lymphocyte: 36.3 %
WBC: 5.1 10*3/uL (ref 3.8–10.8)

## 2022-07-17 LAB — COMPLETE METABOLIC PANEL WITH GFR
AG Ratio: 1.8 (calc) (ref 1.0–2.5)
ALT: 15 U/L (ref 9–46)
AST: 16 U/L (ref 10–40)
Albumin: 4.7 g/dL (ref 3.6–5.1)
Alkaline phosphatase (APISO): 88 U/L (ref 36–130)
BUN: 13 mg/dL (ref 7–25)
CO2: 29 mmol/L (ref 20–32)
Calcium: 9.8 mg/dL (ref 8.6–10.3)
Chloride: 100 mmol/L (ref 98–110)
Creat: 1.22 mg/dL (ref 0.60–1.29)
Globulin: 2.6 g/dL (calc) (ref 1.9–3.7)
Glucose, Bld: 100 mg/dL — ABNORMAL HIGH (ref 65–99)
Potassium: 4.3 mmol/L (ref 3.5–5.3)
Sodium: 140 mmol/L (ref 135–146)
Total Bilirubin: 0.7 mg/dL (ref 0.2–1.2)
Total Protein: 7.3 g/dL (ref 6.1–8.1)
eGFR: 74 mL/min/{1.73_m2} (ref >=60)

## 2022-07-19 ENCOUNTER — Other Ambulatory Visit: Payer: BC Managed Care – PPO

## 2022-07-22 ENCOUNTER — Other Ambulatory Visit: Payer: Self-pay | Admitting: Family Medicine

## 2022-10-26 ENCOUNTER — Other Ambulatory Visit: Payer: Self-pay | Admitting: Family Medicine

## 2022-10-26 DIAGNOSIS — I10 Essential (primary) hypertension: Secondary | ICD-10-CM

## 2022-10-30 ENCOUNTER — Other Ambulatory Visit: Payer: Self-pay | Admitting: Family Medicine

## 2023-02-21 ENCOUNTER — Encounter: Payer: BC Managed Care – PPO | Admitting: Family Medicine

## 2023-03-12 ENCOUNTER — Encounter: Payer: BC Managed Care – PPO | Admitting: Family Medicine

## 2023-03-20 ENCOUNTER — Encounter: Payer: BC Managed Care – PPO | Admitting: Family Medicine

## 2023-04-25 ENCOUNTER — Encounter: Payer: BC Managed Care – PPO | Admitting: Family Medicine

## 2023-05-16 ENCOUNTER — Encounter: Payer: BC Managed Care – PPO | Admitting: Family Medicine

## 2023-05-30 ENCOUNTER — Ambulatory Visit (INDEPENDENT_AMBULATORY_CARE_PROVIDER_SITE_OTHER): Payer: BC Managed Care – PPO | Admitting: Family Medicine

## 2023-05-30 ENCOUNTER — Encounter: Payer: Self-pay | Admitting: Family Medicine

## 2023-05-30 VITALS — BP 139/89 | HR 108 | Ht 75.0 in | Wt 185.0 lb

## 2023-05-30 DIAGNOSIS — Z0000 Encounter for general adult medical examination without abnormal findings: Secondary | ICD-10-CM

## 2023-05-30 DIAGNOSIS — Z23 Encounter for immunization: Secondary | ICD-10-CM | POA: Diagnosis not present

## 2023-05-30 DIAGNOSIS — Z1211 Encounter for screening for malignant neoplasm of colon: Secondary | ICD-10-CM

## 2023-05-30 DIAGNOSIS — Z13220 Encounter for screening for lipoid disorders: Secondary | ICD-10-CM

## 2023-05-30 DIAGNOSIS — I10 Essential (primary) hypertension: Secondary | ICD-10-CM

## 2023-05-30 DIAGNOSIS — F411 Generalized anxiety disorder: Secondary | ICD-10-CM | POA: Diagnosis not present

## 2023-05-30 MED ORDER — LOSARTAN POTASSIUM-HCTZ 100-12.5 MG PO TABS: 1.0000 | ORAL_TABLET | Freq: Every day | ORAL | 1 refills | Status: DC

## 2023-05-30 MED ORDER — ESCITALOPRAM OXALATE 10 MG PO TABS: ORAL_TABLET | ORAL | 0 refills | Status: DC

## 2023-05-30 NOTE — Progress Notes (Signed)
Clinton Burke - 47 y.o. male MRN [id number]  Date of birth: Apr 13, 1976  Subjective Chief Complaint  Patient presents with   Annual Exam    HPI Clinton Burke is a 47 y.o. male here today for annual exam.   Reports that he is doing ok.  Has had some anxiety over the past several months.  Notes a few stressors.  Has seen a therapist in the past but didn't find this very helpful.    Remains on losartan/hydrochlorothiazide for management of HTN.   BP is borderline today  He is moderately active.  Feels like diet is pretty good.   Non-smoker.  Occasional EtOH.   Review of Systems  Constitutional:  Negative for chills, fever, malaise/fatigue and weight loss.  HENT:  Negative for congestion, ear pain and sore throat.   Eyes:  Negative for blurred vision, double vision and pain.  Respiratory:  Negative for cough and shortness of breath.   Cardiovascular:  Negative for chest pain and palpitations.  Gastrointestinal:  Negative for abdominal pain, blood in stool, constipation, heartburn and nausea.  Genitourinary:  Negative for dysuria and urgency.  Musculoskeletal:  Negative for joint pain and myalgias.  Neurological:  Negative for dizziness and headaches.  Endo/Heme/Allergies:  Does not bruise/bleed easily.  Psychiatric/Behavioral:  Negative for depression. The patient is not nervous/anxious and does not have insomnia.     No Known Allergies  Past Medical History:  Diagnosis Date   History of chickenpox    Skin cancer    Unsure of type -- Mohs procedure    Past Surgical History:  Procedure Laterality Date   NO PAST SURGERIES      Social History   Socioeconomic History   Marital status: Single    Spouse name: Not on file   Number of children: Not on file   Years of education: Not on file   Highest education level: Associate degree: academic program  Occupational History   Occupation: Firefighter  Tobacco Use   Smoking status: Never   Smokeless tobacco: Never  Vaping  Use   Vaping status: Never Used  Substance and Sexual Activity   Alcohol use: Yes    Alcohol/week: 12.0 standard drinks of alcohol    Types: 12 Cans of beer per week    Comment: Weekly   Drug use: No   Sexual activity: Yes    Partners: Female  Other Topics Concern   Not on file  Social History Narrative   Not on file   Social Determinants of Health   Financial Resource Strain: Low Risk  (05/29/2023)   Overall Financial Resource Strain (CARDIA)    Difficulty of Paying Living Expenses: Not hard at all  Food Insecurity: No Food Insecurity (05/29/2023)   Hunger Vital Sign    Worried About Running Out of Food in the Last Year: Never true    Ran Out of Food in the Last Year: Never true  Transportation Needs: No Transportation Needs (05/29/2023)   PRAPARE - Administrator, Civil Service (Medical): No    Lack of Transportation (Non-Medical): No  Physical Activity: Sufficiently Active (05/29/2023)   Exercise Vital Sign    Days of Exercise per Week: 3 days    Minutes of Exercise per Session: 90 min  Stress: Stress Concern Present (05/29/2023)   [employer] of Occupational Health - Occupational Stress Questionnaire    Feeling of Stress : Very much  Social Connections: Unknown (05/29/2023)   Social Connection and Isolation  Panel [NHANES]    Frequency of Communication with Friends and Family: Twice a week    Frequency of Social Gatherings with Friends and Family: Twice a week    Attends Religious Services: Patient declined    Database administrator or Organizations: Yes    Attends Banker Meetings: 1 to 4 times per year    Marital Status: Living with partner    Family History  Problem Relation Age of Onset   Skin cancer Mother    Arrhythmia Mother    Breast cancer Mother    Skin cancer Father    Hypertension Father     Health Maintenance  Topic Date Due   HIV Screening  Never done   Hepatitis C Screening  Never done   Colonoscopy  Never done    COVID-19 Vaccine (4 - 2023-24 season) 04/07/2023   DTaP/Tdap/Td (3 - Td or Tdap) 06/21/2032   INFLUENZA VACCINE  Completed   HPV VACCINES  Aged Out     ----------------------------------------------------------------------------------------------------------------------------------------------------------------------------------------------------------------- Physical Exam BP 139/89 (BP Location: Left Arm, Patient Position: Sitting, Cuff Size: Normal)   Pulse (!) 108   Ht 6\' 3"  (1.905 m)   Wt 185 lb (83.9 kg)   SpO2 99%   BMI 23.12 kg/m   Physical Exam Constitutional:      General: He is not in acute distress. HENT:     Head: Normocephalic and atraumatic.     Right Ear: Tympanic membrane and external ear normal.     Left Ear: Tympanic membrane and external ear normal.  Eyes:     General: No scleral icterus. Neck:     Thyroid: No thyromegaly.  Cardiovascular:     Rate and Rhythm: Normal rate and regular rhythm.     Heart sounds: Normal heart sounds.  Pulmonary:     Effort: Pulmonary effort is normal.     Breath sounds: Normal breath sounds.  Abdominal:     General: Bowel sounds are normal. There is no distension.     Palpations: Abdomen is soft.     Tenderness: There is no abdominal tenderness. There is no guarding.  Musculoskeletal:     Cervical back: Normal range of motion.  Lymphadenopathy:     Cervical: No cervical adenopathy.  Skin:    General: Skin is warm and dry.     Findings: No rash.  Neurological:     Mental Status: He is alert and oriented to person, place, and time.     Cranial Nerves: No cranial nerve deficit.     Motor: No abnormal muscle tone.  Psychiatric:        Mood and Affect: Mood normal.        Behavior: Behavior normal.      ------------------------------------------------------------------------------------------------------------------------------------------------------------------------------------------------------------------- Assessment and Plan  Well adult exam Well adult Orders Placed This Encounter  Procedures   Flu vaccine trivalent PF, 6mos and older(Flulaval,Afluria,Fluarix,Fluzone)   CMP14+EGFR   CBC with Differential/Platelet   Lipid Panel With LDL/HDL Ratio   Ambulatory referral to Gastroenterology    Referral Priority:   Routine    Referral Type:   Consultation    Referral Reason:   Specialty Services Required    Referred to Provider:   Varney Baas, MD    Number of Visits Requested:   1  Screenings:  Colonoscopy referral Immunizations:  Flu vaccine given today.  Anticipatory guidance/Risk factor reduction:  Recommendations per AVS.   GAD (generalized anxiety disorder) Starting lexapro with plan to f/u in 4 weeks.  Meds ordered this encounter  Medications   escitalopram (LEXAPRO) 10 MG tablet    Sig: Take 5mg  daily x7 days then increase to 10mg  daily.    Dispense:  90 tablet    Refill:  0   losartan-hydrochlorothiazide (HYZAAR) 100-12.5 MG tablet    Sig: Take 1 tablet by mouth daily.    Dispense:  90 tablet    Refill:  1    Return in about 4 weeks (around 06/27/2023) for anxiety.    This visit occurred during the SARS-CoV-2 public health emergency.  Safety protocols were in place, including screening questions prior to the visit, additional usage of staff PPE, and extensive cleaning of exam room while observing appropriate contact time as indicated for disinfecting solutions.

## 2023-05-30 NOTE — Patient Instructions (Addendum)
Try magnesium glycinate 250-500mg  in the evening.   Start lexapro 1/2 tab x7 days then increase to a full tab.    Preventive Care 110-48 Years Old, Male Preventive care refers to lifestyle choices and visits with your health care provider that can promote health and wellness. Preventive care visits are also called wellness exams. What can I expect for my preventive care visit? Counseling During your preventive care visit, your health care provider may ask about your: Medical history, including: Past medical problems. Family medical history. Current health, including: Emotional well-being. Home life and relationship well-being. Sexual activity. Lifestyle, including: Alcohol, nicotine or tobacco, and drug use. Access to firearms. Diet, exercise, and sleep habits. Safety issues such as seatbelt and bike helmet use. Sunscreen use. Work and work Astronomer. Physical exam Your health care provider will check your: Height and weight. These may be used to calculate your BMI (body mass index). BMI is a measurement that tells if you are at a healthy weight. Waist circumference. This measures the distance around your waistline. This measurement also tells if you are at a healthy weight and may help predict your risk of certain diseases, such as type 2 diabetes and high blood pressure. Heart rate and blood pressure. Body temperature. Skin for abnormal spots. What immunizations do I need?  Vaccines are usually given at various ages, according to a schedule. Your health care provider will recommend vaccines for you based on your age, medical history, and lifestyle or other factors, such as travel or where you work. What tests do I need? Screening Your health care provider may recommend screening tests for certain conditions. This may include: Lipid and cholesterol levels. Diabetes screening. This is done by checking your blood sugar (glucose) after you have not eaten for a while  (fasting). Hepatitis B test. Hepatitis C test. HIV (human immunodeficiency virus) test. STI (sexually transmitted infection) testing, if you are at risk. Lung cancer screening. Prostate cancer screening. Colorectal cancer screening. Talk with your health care provider about your test results, treatment options, and if necessary, the need for more tests. Follow these instructions at home: Eating and drinking  Eat a diet that includes fresh fruits and vegetables, whole grains, lean protein, and low-fat dairy products. Take vitamin and mineral supplements as recommended by your health care provider. Do not drink alcohol if your health care provider tells you not to drink. If you drink alcohol: Limit how much you have to 0-2 drinks a day. Know how much alcohol is in your drink. In the [place] one drink equals one 12 oz bottle of beer (355 mL), one 5 oz glass of wine (148 mL), or one 1 oz glass of hard liquor (44 mL). Lifestyle Brush your teeth every morning and night with fluoride toothpaste. Floss one time each day. Exercise for at least 30 minutes 5 or more days each week. Do not use any products that contain nicotine or tobacco. These products include cigarettes, chewing tobacco, and vaping devices, such as e-cigarettes. If you need help quitting, ask your health care provider. Do not use drugs. If you are sexually active, practice safe sex. Use a condom or other form of protection to prevent STIs. Take aspirin only as told by your health care provider. Make sure that you understand how much to take and what form to take. Work with your health care provider to find out whether it is safe and beneficial for you to take aspirin daily. Find healthy ways to manage stress, such as: Meditation,  yoga, or listening to music. Journaling. Talking to a trusted person. Spending time with friends and family. Minimize exposure to UV radiation to reduce your risk of skin cancer. Safety Always wear  your seat belt while driving or riding in a vehicle. Do not drive: If you have been drinking alcohol. Do not ride with someone who has been drinking. When you are tired or distracted. While texting. If you have been using any mind-altering substances or drugs. Wear a helmet and other protective equipment during sports activities. If you have firearms in your house, make sure you follow all gun safety procedures. What's next? Go to your health care provider once a year for an annual wellness visit. Ask your health care provider how often you should have your eyes and teeth checked. Stay up to date on all vaccines. This information is not intended to replace advice given to you by your health care provider. Make sure you discuss any questions you have with your health care provider. Document Revised: 01/18/2021 Document Reviewed: 01/18/2021 Elsevier Patient Education  2024 [employer].

## 2023-05-30 NOTE — Assessment & Plan Note (Signed)
Starting lexapro with plan to f/u in 4 weeks.

## 2023-05-30 NOTE — Assessment & Plan Note (Signed)
Well adult Orders Placed This Encounter  Procedures   Flu vaccine trivalent PF, 6mos and older(Flulaval,Afluria,Fluarix,Fluzone)   CMP14+EGFR   CBC with Differential/Platelet   Lipid Panel With LDL/HDL Ratio   Ambulatory referral to Gastroenterology    Referral Priority:   Routine    Referral Type:   Consultation    Referral Reason:   Specialty Services Required    Referred to Provider:   Varney Baas, MD    Number of Visits Requested:   1  Screenings:  Colonoscopy referral Immunizations:  Flu vaccine given today.  Anticipatory guidance/Risk factor reduction:  Recommendations per AVS.

## 2023-06-27 ENCOUNTER — Encounter: Payer: Self-pay | Admitting: Family Medicine

## 2023-06-27 ENCOUNTER — Ambulatory Visit: Payer: BC Managed Care – PPO | Admitting: Family Medicine

## 2023-06-27 VITALS — BP 116/78 | HR 90 | Ht 75.0 in | Wt 185.0 lb

## 2023-06-27 DIAGNOSIS — F411 Generalized anxiety disorder: Secondary | ICD-10-CM | POA: Diagnosis not present

## 2023-06-27 MED ORDER — SERTRALINE HCL 25 MG PO TABS: 25.0000 mg | ORAL_TABLET | Freq: Every day | ORAL | 3 refills | Status: DC

## 2023-06-27 NOTE — Progress Notes (Signed)
Clinton Burke - 47 y.o. male MRN [id number]  Date of birth: February 19, 1976  Subjective Chief Complaint  Patient presents with   Hypertension   Anxiety    HPI ODA Clinton Burke is a 47 y.o. male here today for follow up visit.   Started on lexapro at last visit for anxiety.  He does report that this does seem to be effective for his anxiety however he has had fairly consistent nausea since starting this.  He does want to try something different.  ROS:  A comprehensive ROS was completed and negative except as noted per HPI  No Known Allergies  Past Medical History:  Diagnosis Date   History of chickenpox    Skin cancer    Unsure of type -- Mohs procedure    Past Surgical History:  Procedure Laterality Date   NO PAST SURGERIES      Social History   Socioeconomic History   Marital status: Single    Spouse name: Not on file   Number of children: Not on file   Years of education: Not on file   Highest education level: Associate degree: academic program  Occupational History   Occupation: Firefighter  Tobacco Use   Smoking status: Never   Smokeless tobacco: Never  Vaping Use   Vaping status: Never Used  Substance and Sexual Activity   Alcohol use: Yes    Alcohol/week: 12.0 standard drinks of alcohol    Types: 12 Cans of beer per week    Comment: Weekly   Drug use: No   Sexual activity: Yes    Partners: Female  Other Topics Concern   Not on file  Social History Narrative   Not on file   Social Determinants of Health   Financial Resource Strain: Low Risk  (05/29/2023)   Overall Financial Resource Strain (CARDIA)    Difficulty of Paying Living Expenses: Not hard at all  Food Insecurity: No Food Insecurity (05/29/2023)   Hunger Vital Sign    Worried About Running Out of Food in the Last Year: Never true    Ran Out of Food in the Last Year: Never true  Transportation Needs: No Transportation Needs (05/29/2023)   PRAPARE - Administrator, Civil Service  (Medical): No    Lack of Transportation (Non-Medical): No  Physical Activity: Sufficiently Active (05/29/2023)   Exercise Vital Sign    Days of Exercise per Week: 3 days    Minutes of Exercise per Session: 90 min  Stress: Stress Concern Present (05/29/2023)   [employer] of Occupational Health - Occupational Stress Questionnaire    Feeling of Stress : Very much  Social Connections: Unknown (05/29/2023)   Social Connection and Isolation Panel [NHANES]    Frequency of Communication with Friends and Family: Twice a week    Frequency of Social Gatherings with Friends and Family: Twice a week    Attends Religious Services: Patient declined    Database administrator or Organizations: Yes    Attends Engineer, structural: 1 to 4 times per year    Marital Status: Living with partner    Family History  Problem Relation Age of Onset   Skin cancer Mother    Arrhythmia Mother    Breast cancer Mother    Skin cancer Father    Hypertension Father     Health Maintenance  Topic Date Due   HIV Screening  Never done   Hepatitis C Screening  Never done   Colonoscopy  Never done   COVID-19 Vaccine (4 - 2023-24 season) 04/07/2023   DTaP/Tdap/Td (3 - Td or Tdap) 06/21/2032   INFLUENZA VACCINE  Completed   HPV VACCINES  Aged Out     ----------------------------------------------------------------------------------------------------------------------------------------------------------------------------------------------------------------- Physical Exam BP 116/78 (BP Location: Left Arm, Patient Position: Sitting, Cuff Size: Normal)   Pulse 90   Ht 6\' 3"  (1.905 m)   Wt 185 lb (83.9 kg)   SpO2 100%   BMI 23.12 kg/m   Physical Exam Constitutional:      Appearance: Normal appearance.  HENT:     Head: Normocephalic and atraumatic.  Cardiovascular:     Rate and Rhythm: Normal rate and regular rhythm.  Pulmonary:     Effort: Pulmonary effort is normal.     Breath sounds:  Normal breath sounds.  Neurological:     Mental Status: He is alert.  Psychiatric:        Mood and Affect: Mood normal.        Behavior: Behavior normal.     ------------------------------------------------------------------------------------------------------------------------------------------------------------------------------------------------------------------- Assessment and Plan  GAD (generalized anxiety disorder) He did have improvement with Lexapro but did not tolerate well due to nausea.  Changing from Lexapro to sertraline.  He will let me know if he continues to have side effects with change after few weeks.   Meds ordered this encounter  Medications   sertraline (ZOLOFT) 25 MG tablet    Sig: Take 1 tablet (25 mg total) by mouth daily.    Dispense:  30 tablet    Refill:  3    No follow-ups on file.    This visit occurred during the SARS-CoV-2 public health emergency.  Safety protocols were in place, including screening questions prior to the visit, additional usage of staff PPE, and extensive cleaning of exam room while observing appropriate contact time as indicated for disinfecting solutions.

## 2023-06-27 NOTE — Assessment & Plan Note (Signed)
He did have improvement with Lexapro but did not tolerate well due to nausea.  Changing from Lexapro to sertraline.  He will let me know if he continues to have side effects with change after few weeks.

## 2023-06-28 LAB — CMP14+EGFR
ALT: 14 [IU]/L (ref 0–44)
AST: 15 [IU]/L (ref 0–40)
Albumin: 4.8 g/dL (ref 4.1–5.1)
Alkaline Phosphatase: 104 [IU]/L (ref 44–121)
BUN/Creatinine Ratio: 11 (ref 9–20)
BUN: 13 mg/dL (ref 6–24)
Bilirubin Total: 1 mg/dL (ref 0.0–1.2)
CO2: 24 mmol/L (ref 20–29)
Calcium: 10.2 mg/dL (ref 8.7–10.2)
Chloride: 97 mmol/L (ref 96–106)
Creatinine, Ser: 1.19 mg/dL (ref 0.76–1.27)
Globulin, Total: 2.5 g/dL (ref 1.5–4.5)
Glucose: 110 mg/dL — ABNORMAL HIGH (ref 70–99)
Potassium: 4.2 mmol/L (ref 3.5–5.2)
Sodium: 136 mmol/L (ref 134–144)
Total Protein: 7.3 g/dL (ref 6.0–8.5)
eGFR: 75 mL/min/{1.73_m2} (ref >59)

## 2023-06-28 LAB — CBC WITH DIFFERENTIAL/PLATELET
Basophils Absolute: 0.1 10*3/uL (ref 0.0–0.2)
Basos: 1 %
EOS (ABSOLUTE): 0.1 10*3/uL (ref 0.0–0.4)
Eos: 2 %
Hematocrit: 43.9 % (ref 37.5–51.0)
Hemoglobin: 15.4 g/dL (ref 13.0–17.7)
Immature Grans (Abs): 0 10*3/uL (ref 0.0–0.1)
Immature Granulocytes: 0 %
Lymphocytes Absolute: 1.5 10*3/uL (ref 0.7–3.1)
Lymphs: 28 %
MCH: 31.8 pg (ref 26.6–33.0)
MCHC: 35.1 g/dL (ref 31.5–35.7)
MCV: 91 fL (ref 79–97)
Monocytes Absolute: 0.6 10*3/uL (ref 0.1–0.9)
Monocytes: 12 %
Neutrophils Absolute: 3 10*3/uL (ref 1.4–7.0)
Neutrophils: 57 %
Platelets: 246 10*3/uL (ref 150–450)
RBC: 4.85 x10E6/uL (ref 4.14–5.80)
RDW: 12.8 % (ref 11.6–15.4)
WBC: 5.3 10*3/uL (ref 3.4–10.8)

## 2023-06-28 LAB — LIPID PANEL WITH LDL/HDL RATIO
Cholesterol, Total: 204 mg/dL — ABNORMAL HIGH (ref 100–199)
HDL: 98 mg/dL (ref >39)
LDL Chol Calc (NIH): 90 mg/dL (ref 0–99)
LDL/HDL Ratio: 0.9 ratio (ref 0.0–3.6)
Triglycerides: 91 mg/dL (ref 0–149)
VLDL Cholesterol Cal: 16 mg/dL (ref 5–40)

## 2023-07-19 ENCOUNTER — Other Ambulatory Visit: Payer: Self-pay | Admitting: Family Medicine

## 2023-07-21 ENCOUNTER — Encounter: Payer: Self-pay | Admitting: Family Medicine

## 2023-08-07 DIAGNOSIS — D369 Benign neoplasm, unspecified site: Secondary | ICD-10-CM | POA: Insufficient documentation

## 2023-08-25 ENCOUNTER — Other Ambulatory Visit: Payer: Self-pay | Admitting: Family Medicine

## 2023-08-27 ENCOUNTER — Other Ambulatory Visit: Payer: Self-pay | Admitting: Family Medicine

## 2023-08-27 NOTE — Telephone Encounter (Signed)
He is prescribed sertraline.  Is this not covered.  Does PA need to be done?

## 2023-08-29 MED ORDER — SERTRALINE HCL 50 MG PO TABS: 50.0000 mg | ORAL_TABLET | Freq: Every day | ORAL | 1 refills | Status: DC

## 2023-11-22 ENCOUNTER — Other Ambulatory Visit: Payer: Self-pay | Admitting: Family Medicine

## 2023-11-25 NOTE — Telephone Encounter (Signed)
 I called patient left vm to call back and schedule an appointment for May 2025

## 2023-11-25 NOTE — Telephone Encounter (Signed)
 Pls contact pt to schedule 6 mth follow-up with Dr. Augustus Ledger due in May. Thanks

## 2024-02-20 ENCOUNTER — Other Ambulatory Visit: Payer: Self-pay | Admitting: Family Medicine

## 2024-02-20 NOTE — Telephone Encounter (Signed)
 Pls contact pt to schedule HTN appt with Dr. Augustus Ledger. Sending 30 day refill. Thx

## 2024-02-29 ENCOUNTER — Other Ambulatory Visit: Payer: Self-pay | Admitting: Family Medicine

## 2024-03-03 ENCOUNTER — Ambulatory Visit: Admitting: Family Medicine

## 2024-03-16 ENCOUNTER — Other Ambulatory Visit: Payer: Self-pay | Admitting: Family Medicine

## 2024-03-16 NOTE — Telephone Encounter (Signed)
 Hold 03/19/24 appt

## 2024-03-19 ENCOUNTER — Encounter: Admitting: Family Medicine

## 2024-04-19 ENCOUNTER — Encounter: Payer: Self-pay | Admitting: Family Medicine

## 2024-06-01 ENCOUNTER — Ambulatory Visit: Admitting: Family Medicine

## 2024-06-01 VITALS — BP 155/95 | HR 96 | Ht 75.0 in | Wt 188.0 lb

## 2024-06-01 DIAGNOSIS — I10 Essential (primary) hypertension: Secondary | ICD-10-CM

## 2024-06-01 DIAGNOSIS — Z0000 Encounter for general adult medical examination without abnormal findings: Secondary | ICD-10-CM | POA: Diagnosis not present

## 2024-06-01 DIAGNOSIS — Z13220 Encounter for screening for lipoid disorders: Secondary | ICD-10-CM | POA: Diagnosis not present

## 2024-06-01 DIAGNOSIS — Z23 Encounter for immunization: Secondary | ICD-10-CM

## 2024-06-01 MED ORDER — LOSARTAN POTASSIUM-HCTZ 100-12.5 MG PO TABS: 1.0000 | ORAL_TABLET | Freq: Every day | ORAL | 0 refills | Status: DC

## 2024-06-01 NOTE — Assessment & Plan Note (Signed)
 Well adult Orders Placed This Encounter  Procedures   Flu vaccine trivalent PF, 6mos and older(Flulaval,Afluria,Fluarix,Fluzone)   CMP14+EGFR   CBC with Differential/Platelet   Lipid Panel With LDL/HDL Ratio  Screenings:  Colonoscopy referral Immunizations:  Declines Anticipatory guidance/Risk factor reduction:  Recommendations per AVS.

## 2024-06-01 NOTE — Patient Instructions (Signed)

## 2024-06-01 NOTE — Progress Notes (Signed)
 Clinton Burke - 48 y.o. male MRN [id number]  Date of birth: 1975-09-15  Subjective Chief Complaint  Patient presents with   Annual Exam    HPI Clinton Burke is a 48 y.o. male here today for annual exam.   He has been out of BP medicine x1 week.  BP is elevated today.   He remains moderately active.  He feels that he is doing well with diet.   He is a non-smoker.   Moderate EtOH intake.   Review of Systems  Constitutional:  Negative for chills, fever, malaise/fatigue and weight loss.  HENT:  Negative for congestion, ear pain and sore throat.   Eyes:  Negative for blurred vision, double vision and pain.  Respiratory:  Negative for cough and shortness of breath.   Cardiovascular:  Negative for chest pain and palpitations.  Gastrointestinal:  Negative for abdominal pain, blood in stool, constipation, heartburn and nausea.  Genitourinary:  Negative for dysuria and urgency.  Musculoskeletal:  Negative for joint pain and myalgias.  Neurological:  Negative for dizziness and headaches.  Endo/Heme/Allergies:  Does not bruise/bleed easily.  Psychiatric/Behavioral:  Negative for depression. The patient is not nervous/anxious and does not have insomnia.     No Known Allergies  Past Medical History:  Diagnosis Date   History of chickenpox    Skin cancer    Unsure of type -- Mohs procedure    Past Surgical History:  Procedure Laterality Date   NO PAST SURGERIES      Social History   Socioeconomic History   Marital status: Single    Spouse name: Not on file   Number of children: Not on file   Years of education: Not on file   Highest education level: Associate degree: occupational, scientist, product/process development, or vocational program  Occupational History   Occupation: Firefighter  Tobacco Use   Smoking status: Never   Smokeless tobacco: Never  Vaping Use   Vaping status: Never Used  Substance and Sexual Activity   Alcohol use: Yes    Alcohol/week: 12.0 standard drinks of alcohol     Types: 12 Cans of beer per week    Comment: Weekly   Drug use: No   Sexual activity: Yes    Partners: Female  Other Topics Concern   Not on file  Social History Narrative   Not on file   Social Drivers of Health   Financial Resource Strain: Low Risk  (06/01/2024)   Overall Financial Resource Strain (CARDIA)    Difficulty of Paying Living Expenses: Not hard at all  Food Insecurity: No Food Insecurity (06/01/2024)   Hunger Vital Sign    Worried About Running Out of Food in the Last Year: Never true    Ran Out of Food in the Last Year: Never true  Transportation Needs: No Transportation Needs (06/01/2024)   PRAPARE - Administrator, Civil Service (Medical): No    Lack of Transportation (Non-Medical): No  Physical Activity: Sufficiently Active (06/01/2024)   Exercise Vital Sign    Days of Exercise per Week: 3 days    Minutes of Exercise per Session: 130 min  Stress: Stress Concern Present (06/01/2024)   [employer] of Occupational Health - Occupational Stress Questionnaire    Feeling of Stress: To some extent  Social Connections: Moderately Integrated (06/01/2024)   Social Connection and Isolation Panel    Frequency of Communication with Friends and Family: More than three times a week    Frequency of Social Gatherings  with Friends and Family: More than three times a week    Attends Religious Services: Patient declined    Active Member of Clubs or Organizations: Yes    Attends Banker Meetings: Patient declined    Marital Status: Living with partner    Family History  Problem Relation Age of Onset   Skin cancer Mother    Arrhythmia Mother    Breast cancer Mother    Skin cancer Father    Hypertension Father     Health Maintenance  Topic Date Due   HIV Screening  Never done   Hepatitis C Screening  Never done   Hepatitis B Vaccines 19-59 Average Risk (1 of 3 - 19+ 3-dose series) 06/01/2025 (Originally 08/20/1993)   COVID-19 Vaccine (4 -  2025-26 season) 06/17/2025 (Originally 04/06/2024)   Colonoscopy  08/19/2026   DTaP/Tdap/Td (3 - Td or Tdap) 06/21/2032   Influenza Vaccine  Completed   Pneumococcal Vaccine  Aged Out   HPV VACCINES  Aged Out   Meningococcal B Vaccine  Aged Out     ----------------------------------------------------------------------------------------------------------------------------------------------------------------------------------------------------------------- Physical Exam BP (!) 155/95   Pulse 96   Ht 6' 3 (1.905 m)   Wt 188 lb (85.3 kg)   SpO2 99%   BMI 23.50 kg/m   Physical Exam  ------------------------------------------------------------------------------------------------------------------------------------------------------------------------------------------------------------------- Assessment and Plan  Well adult exam Well adult Orders Placed This Encounter  Procedures   Flu vaccine trivalent PF, 6mos and older(Flulaval,Afluria,Fluarix,Fluzone)   CMP14+EGFR   CBC with Differential/Platelet   Lipid Panel With LDL/HDL Ratio  Screenings:  Colonoscopy referral Immunizations:  Declines Anticipatory guidance/Risk factor reduction:  Recommendations per AVS.   Essential hypertension BP elevated today.  Restart medication and f/u in 2 weeks for nurse visit.    Meds ordered this encounter  Medications   losartan -hydrochlorothiazide (HYZAAR) 100-12.5 MG tablet    Sig: Take 1 tablet by mouth daily.    Dispense:  90 tablet    Refill:  0    Return in about 2 weeks (around 06/15/2024) for nurse visit for BP check.

## 2024-06-01 NOTE — Assessment & Plan Note (Signed)
 BP elevated today.  Restart medication and f/u in 2 weeks for nurse visit.

## 2024-06-02 LAB — CMP14+EGFR
ALT: 15 IU/L (ref 0–44)
AST: 15 IU/L (ref 0–40)
Albumin: 4.6 g/dL (ref 4.1–5.1)
Alkaline Phosphatase: 106 IU/L (ref 47–123)
BUN/Creatinine Ratio: 13 (ref 9–20)
BUN: 15 mg/dL (ref 6–24)
Bilirubin Total: 1.2 mg/dL (ref 0.0–1.2)
CO2: 23 mmol/L (ref 20–29)
Calcium: 9.8 mg/dL (ref 8.7–10.2)
Chloride: 97 mmol/L (ref 96–106)
Creatinine, Ser: 1.18 mg/dL (ref 0.76–1.27)
Globulin, Total: 2.6 g/dL (ref 1.5–4.5)
Glucose: 94 mg/dL (ref 70–99)
Potassium: 4.3 mmol/L (ref 3.5–5.2)
Sodium: 136 mmol/L (ref 134–144)
Total Protein: 7.2 g/dL (ref 6.0–8.5)
eGFR: 76 mL/min/1.73 (ref >59)

## 2024-06-02 LAB — LIPID PANEL WITH LDL/HDL RATIO
Cholesterol, Total: 187 mg/dL (ref 100–199)
HDL: 94 mg/dL (ref >39)
LDL Chol Calc (NIH): 80 mg/dL (ref 0–99)
LDL/HDL Ratio: 0.9 ratio (ref 0.0–3.6)
Triglycerides: 68 mg/dL (ref 0–149)
VLDL Cholesterol Cal: 13 mg/dL (ref 5–40)

## 2024-06-02 LAB — CBC WITH DIFFERENTIAL/PLATELET
Basophils Absolute: 0.1 x10E3/uL (ref 0.0–0.2)
Basos: 1 %
EOS (ABSOLUTE): 0.1 x10E3/uL (ref 0.0–0.4)
Eos: 1 %
Hematocrit: 43.9 % (ref 37.5–51.0)
Hemoglobin: 14.9 g/dL (ref 13.0–17.7)
Immature Grans (Abs): 0 x10E3/uL (ref 0.0–0.1)
Immature Granulocytes: 0 %
Lymphocytes Absolute: 1.6 x10E3/uL (ref 0.7–3.1)
Lymphs: 27 %
MCH: 30.5 pg (ref 26.6–33.0)
MCHC: 33.9 g/dL (ref 31.5–35.7)
MCV: 90 fL (ref 79–97)
Monocytes Absolute: 0.7 x10E3/uL (ref 0.1–0.9)
Monocytes: 11 %
Neutrophils Absolute: 3.5 x10E3/uL (ref 1.4–7.0)
Neutrophils: 60 %
Platelets: 231 x10E3/uL (ref 150–450)
RBC: 4.89 x10E6/uL (ref 4.14–5.80)
RDW: 13.6 % (ref 11.6–15.4)
WBC: 5.9 x10E3/uL (ref 3.4–10.8)

## 2024-06-02 LAB — SPECIMEN STATUS REPORT

## 2024-06-17 NOTE — Progress Notes (Signed)
   Subjective:    Patient ID: Clinton Burke, male    DOB: 1976/04/24, 48 y.o.   MRN: [id number]  HPI  Patient is here for a 2 wk BP recheck. Last OV it was 155/95 patient takes losartan -hydrochlorothiazide 100-12.5, but last OV pt states he had been out of his medication about 1 week. Denies heart palpitations, SOB, CP, headache, or vision changes.  Review of Systems     Objective:   Physical Exam        Assessment & Plan:   Patients first BP reading is 134/92. Pt sat for 10 mins and then it was retaken. Second reading was 122/87. Reported to Dr. Alvia who was satisfied with this reading. Pt to RTC PRN

## 2024-06-18 ENCOUNTER — Ambulatory Visit: Payer: Self-pay | Admitting: Family Medicine

## 2024-06-18 ENCOUNTER — Ambulatory Visit (INDEPENDENT_AMBULATORY_CARE_PROVIDER_SITE_OTHER)

## 2024-06-18 VITALS — BP 134/92 | HR 97 | Resp 18 | Ht 75.0 in | Wt 188.0 lb

## 2024-06-18 DIAGNOSIS — I10 Essential (primary) hypertension: Secondary | ICD-10-CM | POA: Diagnosis not present

## 2024-08-27 ENCOUNTER — Other Ambulatory Visit: Payer: Self-pay | Admitting: Family Medicine

## 2024-08-27 DIAGNOSIS — I10 Essential (primary) hypertension: Secondary | ICD-10-CM
